# Patient Record
Sex: Female | Born: 1975 | Race: White | Hispanic: No | Marital: Married | Smoking: Never smoker
Health system: Southern US, Community
[De-identification: ages and names within clinical notes are randomized; demographics above are authoritative.]

## PROBLEM LIST (undated history)

## (undated) DIAGNOSIS — Z8049 Family history of malignant neoplasm of other genital organs: Secondary | ICD-10-CM

## (undated) DIAGNOSIS — G43109 Migraine with aura, not intractable, without status migrainosus: Secondary | ICD-10-CM

## (undated) DIAGNOSIS — Z8041 Family history of malignant neoplasm of ovary: Secondary | ICD-10-CM

## (undated) DIAGNOSIS — Z8669 Personal history of other diseases of the nervous system and sense organs: Secondary | ICD-10-CM

## (undated) DIAGNOSIS — B379 Candidiasis, unspecified: Secondary | ICD-10-CM

## (undated) DIAGNOSIS — Z803 Family history of malignant neoplasm of breast: Secondary | ICD-10-CM

## (undated) DIAGNOSIS — N3010 Interstitial cystitis (chronic) without hematuria: Secondary | ICD-10-CM

## (undated) DIAGNOSIS — Z800 Family history of malignant neoplasm of digestive organs: Secondary | ICD-10-CM

## (undated) DIAGNOSIS — R51 Headache: Secondary | ICD-10-CM

## (undated) HISTORY — DX: Migraine with aura, not intractable, without status migrainosus: G43.109

## (undated) HISTORY — DX: Family history of malignant neoplasm of ovary: Z80.41

## (undated) HISTORY — DX: Family history of malignant neoplasm of digestive organs: Z80.0

## (undated) HISTORY — PX: WISDOM TOOTH EXTRACTION: SHX21

## (undated) HISTORY — DX: Personal history of other diseases of the nervous system and sense organs: Z86.69

## (undated) HISTORY — PX: BREAST SURGERY: SHX581

## (undated) HISTORY — PX: TONSILLECTOMY: SUR1361

## (undated) HISTORY — DX: Family history of malignant neoplasm of other genital organs: Z80.49

## (undated) HISTORY — DX: Candidiasis, unspecified: B37.9

## (undated) HISTORY — DX: Family history of malignant neoplasm of breast: Z80.3

## (undated) HISTORY — DX: Interstitial cystitis (chronic) without hematuria: N30.10

## (undated) HISTORY — PX: TOOTH EXTRACTION: SUR596

## (undated) HISTORY — PX: POLYPECTOMY: SHX149

---

## 2010-10-30 LAB — ABO/RH: RH Type: POSITIVE

## 2010-10-30 LAB — HEPATITIS B SURFACE ANTIGEN: Hepatitis B Surface Ag: NEGATIVE

## 2010-10-30 LAB — ANTIBODY SCREEN: Antibody Screen: NEGATIVE

## 2010-10-30 LAB — RPR: RPR: NONREACTIVE

## 2010-11-09 ENCOUNTER — Other Ambulatory Visit: Payer: Self-pay | Admitting: Obstetrics and Gynecology

## 2010-11-09 DIAGNOSIS — N63 Unspecified lump in unspecified breast: Secondary | ICD-10-CM

## 2010-11-12 ENCOUNTER — Other Ambulatory Visit: Payer: Self-pay

## 2010-11-14 ENCOUNTER — Ambulatory Visit
Admission: RE | Admit: 2010-11-14 | Discharge: 2010-11-14 | Disposition: A | Discharge status: Home / Self Care | Payer: 59 | Source: Ambulatory Visit | Attending: Obstetrics and Gynecology | Admitting: Obstetrics and Gynecology

## 2010-11-14 DIAGNOSIS — N63 Unspecified lump in unspecified breast: Secondary | ICD-10-CM

## 2011-03-26 NOTE — L&D Delivery Note (Signed)
Delivery Note At 5:10 PM a viable female was delivered via Vaginal, Spontaneous Delivery (Presentation: Left Occiput Anterior).  APGAR: 9, ; weight .   Placenta status: Intact, Spontaneous.  Cord: 3 vessel.Water birth with pt leaning over edge of tub up out of water, baby born above water, Pt, assisted to sit in water  With baby in arms after delivery.  Anesthesia: Local  Episiotomy: None Lacerations: 2nd degree;Periurethral Suture Repair: 3.0 monocryl Est. Blood Loss (mL): 200  Mom to postpartum.  Baby to rooming in.  Tia Hieronymus 05/31/2011, 6:32 PM

## 2011-05-17 LAB — STREP B DNA PROBE: GBS: NEGATIVE

## 2011-05-23 ENCOUNTER — Encounter (INDEPENDENT_AMBULATORY_CARE_PROVIDER_SITE_OTHER): Payer: 59

## 2011-05-23 DIAGNOSIS — Z331 Pregnant state, incidental: Secondary | ICD-10-CM

## 2011-05-29 ENCOUNTER — Encounter (INDEPENDENT_AMBULATORY_CARE_PROVIDER_SITE_OTHER): Payer: 59

## 2011-05-29 DIAGNOSIS — Z331 Pregnant state, incidental: Secondary | ICD-10-CM

## 2011-05-31 ENCOUNTER — Encounter: Payer: Self-pay | Admitting: Registered Nurse

## 2011-05-31 ENCOUNTER — Inpatient Hospital Stay (HOSPITAL_COMMUNITY)
Admission: AD | Admit: 2011-05-31 | Discharge: 2011-06-02 | DRG: 775 | Disposition: A | Discharge status: Home / Self Care | Payer: 59 | Source: Ambulatory Visit | Attending: Obstetrics and Gynecology | Admitting: Obstetrics and Gynecology

## 2011-05-31 ENCOUNTER — Encounter (HOSPITAL_COMMUNITY): Payer: Self-pay | Admitting: *Deleted

## 2011-05-31 DIAGNOSIS — IMO0001 Reserved for inherently not codable concepts without codable children: Secondary | ICD-10-CM

## 2011-05-31 HISTORY — DX: Headache: R51

## 2011-05-31 LAB — CBC
HCT: 37.1 % (ref 36.0–46.0)
MCHC: 34.2 g/dL (ref 30.0–36.0)
MCV: 92.5 fL (ref 78.0–100.0)
Platelets: 210 10*3/uL (ref 150–400)
RDW: 13 % (ref 11.5–15.5)
WBC: 9.3 10*3/uL (ref 4.0–10.5)

## 2011-05-31 MED ORDER — BENZOCAINE-MENTHOL 20-0.5 % EX AERO
1.0000 "application " | INHALATION_SPRAY | CUTANEOUS | Status: DC | PRN
  Administered 2011-05-31: 1 via TOPICAL

## 2011-05-31 MED ORDER — ONDANSETRON HCL 4 MG/2ML IJ SOLN: 4.0000 mg | INTRAMUSCULAR | Status: DC | PRN

## 2011-05-31 MED ORDER — SODIUM CHLORIDE 0.9 % IJ SOLN: 3.0000 mL | INTRAMUSCULAR | Status: DC | PRN

## 2011-05-31 MED ORDER — OXYTOCIN BOLUS FROM INFUSION
500.0000 mL | Freq: Once | INTRAVENOUS | Status: DC
  Filled 2011-05-31: qty 1000
  Filled 2011-05-31: qty 500

## 2011-05-31 MED ORDER — OXYCODONE-ACETAMINOPHEN 5-325 MG PO TABS: 1.0000 | ORAL_TABLET | ORAL | Status: DC | PRN

## 2011-05-31 MED ORDER — CITRIC ACID-SODIUM CITRATE 334-500 MG/5ML PO SOLN: 30.0000 mL | ORAL | Status: DC | PRN

## 2011-05-31 MED ORDER — OXYTOCIN 20 UNITS IN LACTATED RINGERS INFUSION - SIMPLE
125.0000 mL/h | Freq: Once | INTRAVENOUS | Status: DC
Start: 2011-05-31 — End: 2011-05-31

## 2011-05-31 MED ORDER — ONDANSETRON HCL 4 MG/2ML IJ SOLN: 4.0000 mg | Freq: Four times a day (QID) | INTRAMUSCULAR | Status: DC | PRN

## 2011-05-31 MED ORDER — IBUPROFEN 600 MG PO TABS
600.0000 mg | ORAL_TABLET | Freq: Four times a day (QID) | ORAL | Status: DC | PRN
  Administered 2011-05-31: 600 mg via ORAL
  Filled 2011-05-31: qty 1

## 2011-05-31 MED ORDER — FLEET ENEMA 7-19 GM/118ML RE ENEM: 1.0000 | ENEMA | RECTAL | Status: DC | PRN

## 2011-05-31 MED ORDER — ZOLPIDEM TARTRATE 5 MG PO TABS: 5.0000 mg | ORAL_TABLET | Freq: Every evening | ORAL | Status: DC | PRN

## 2011-05-31 MED ORDER — BENZOCAINE-MENTHOL 20-0.5 % EX AERO
INHALATION_SPRAY | CUTANEOUS | Status: AC
  Administered 2011-05-31: 1 via TOPICAL
  Filled 2011-05-31: qty 56

## 2011-05-31 MED ORDER — TETANUS-DIPHTH-ACELL PERTUSSIS 5-2.5-18.5 LF-MCG/0.5 IM SUSP: 0.5000 mL | Freq: Once | INTRAMUSCULAR | Status: DC

## 2011-05-31 MED ORDER — LANOLIN HYDROUS EX OINT: TOPICAL_OINTMENT | CUTANEOUS | Status: DC | PRN

## 2011-05-31 MED ORDER — DIPHENHYDRAMINE HCL 25 MG PO CAPS: 25.0000 mg | ORAL_CAPSULE | Freq: Four times a day (QID) | ORAL | Status: DC | PRN

## 2011-05-31 MED ORDER — ACETAMINOPHEN 325 MG PO TABS: 650.0000 mg | ORAL_TABLET | ORAL | Status: DC | PRN

## 2011-05-31 MED ORDER — SODIUM CHLORIDE 0.9 % IJ SOLN: 3.0000 mL | Freq: Two times a day (BID) | INTRAMUSCULAR | Status: DC

## 2011-05-31 MED ORDER — SODIUM CHLORIDE 0.9 % IJ SOLN
INTRAMUSCULAR | Status: AC
  Filled 2011-05-31: qty 6

## 2011-05-31 MED ORDER — IBUPROFEN 600 MG PO TABS
600.0000 mg | ORAL_TABLET | Freq: Four times a day (QID) | ORAL | Status: DC
  Administered 2011-06-01 – 2011-06-02 (×7): 600 mg via ORAL
  Filled 2011-05-31 (×7): qty 1

## 2011-05-31 MED ORDER — LACTATED RINGERS IV SOLN: 500.0000 mL | INTRAVENOUS | Status: DC | PRN

## 2011-05-31 MED ORDER — DIBUCAINE 1 % RE OINT: 1.0000 "application " | TOPICAL_OINTMENT | RECTAL | Status: DC | PRN

## 2011-05-31 MED ORDER — SENNOSIDES-DOCUSATE SODIUM 8.6-50 MG PO TABS
2.0000 | ORAL_TABLET | Freq: Every day | ORAL | Status: DC
  Administered 2011-05-31 – 2011-06-01 (×2): 2 via ORAL

## 2011-05-31 MED ORDER — LIDOCAINE HCL (PF) 1 % IJ SOLN
30.0000 mL | INTRAMUSCULAR | Status: DC | PRN
  Administered 2011-05-31: 30 mL via SUBCUTANEOUS
  Filled 2011-05-31: qty 30

## 2011-05-31 MED ORDER — SIMETHICONE 80 MG PO CHEW: 80.0000 mg | CHEWABLE_TABLET | ORAL | Status: DC | PRN

## 2011-05-31 MED ORDER — WITCH HAZEL-GLYCERIN EX PADS: 1.0000 "application " | MEDICATED_PAD | CUTANEOUS | Status: DC | PRN

## 2011-05-31 MED ORDER — BUTORPHANOL TARTRATE 2 MG/ML IJ SOLN: 1.0000 mg | INTRAMUSCULAR | Status: DC | PRN

## 2011-05-31 MED ORDER — PRENATAL MULTIVITAMIN CH
1.0000 | ORAL_TABLET | Freq: Every day | ORAL | Status: DC
  Administered 2011-06-01 – 2011-06-02 (×2): 1 via ORAL
  Filled 2011-05-31 (×2): qty 1

## 2011-05-31 MED ORDER — SODIUM CHLORIDE 0.9 % IV SOLN: 250.0000 mL | INTRAVENOUS | Status: DC | PRN

## 2011-05-31 MED ORDER — ONDANSETRON HCL 4 MG PO TABS: 4.0000 mg | ORAL_TABLET | ORAL | Status: DC | PRN

## 2011-05-31 NOTE — H&P (Signed)
Cynthia Dodson is a 36 y.o. female presenting for c/o of contractions, denies srom or vag bleeding, with +FM, plans water birth. History OB History    Grav Para Term Preterm Abortions TAB SAB Ect Mult Living   3 2 2  0 0 0 0 0 0 2     Past Medical History  Diagnosis Date  . Headache    Past Surgical History  Procedure Date  . Polypectomy   . Tonsillectomy    Family History: family history includes Cancer in her mother and Depression in her mother. Social History:  reports that she has never smoked. She does not have any smokeless tobacco history on file. She reports that she does not drink alcohol or use illicit drugs.  ROS Blood pressure 136/102, pulse 82, temperature 98 F (36.7 C), temperature source Oral, resp. rate 20, height 5\' 4"  (1.626 m), weight 62.596 kg (138 lb). Exam Physical Exam calm, no distress, lungs clear bilaterally, AP RRR, abd soft, gravid, nt, bowel sounds active, no edema, Fhts category 1 uc moderate vag 4 100 -1 VTX  Prenatal labs: ABO, Rh: B/Positive/-- (08/07 0000) Antibody: Negative (08/07 0000) Rubella: Immune (08/07 0000) RPR: Nonreactive (08/07 0000)  HBsAg: Negative (08/07 0000)  HIV: Non-reactive (08/07 0000)  GBS: Negative (02/22 0000)   Assessment/Plan: 40 week IUP Active labor P routine admission, collaboration with Dr. Estanislado Pandy per telephone.   Evyn Putzier 05/31/2011, 6:41 PM

## 2011-06-01 ENCOUNTER — Encounter (HOSPITAL_COMMUNITY): Payer: Self-pay | Admitting: *Deleted

## 2011-06-01 LAB — CBC
MCH: 31.8 pg (ref 26.0–34.0)
Platelets: 172 10*3/uL (ref 150–400)
RBC: 3.59 MIL/uL — ABNORMAL LOW (ref 3.87–5.11)
WBC: 9.4 10*3/uL (ref 4.0–10.5)

## 2011-06-01 NOTE — Progress Notes (Signed)
Post Partum Day 1 Subjective:  Well. Lochia are normal. Voiding, ambulating, tolerating normal diet. feeding going well.  Objective: Blood pressure 132/87, pulse 69, temperature 98.3 F (36.8 C), temperature source Oral, resp. rate 18, height 5\' 4"  (1.626 m), weight 62.596 kg (138 lb), SpO2 97.00%, unknown if currently breastfeeding.  Physical Exam:  General: normal Lochia: appropriate Uterine Fundus: 0/1 firm non-tender  Extremities: No evidence of DVT seen on physical exam. Edema none     Basename 06/01/11 0516 05/31/11 1550  HGB 11.4* 12.7  HCT 33.1* 37.1    Assessment/Plan: Normal Post-partum. Continue routine post-partum care. Anticipate discharge tomorrow Newborn Circumcision reviewed and performed without complications    LOS: 1 day   Suprina Mandeville A MD 06/01/2011, 9:18 AM

## 2011-06-01 NOTE — H&P (Signed)
Kariel Skillman is a 36 y.o. female presenting for painful contractions, denies srom or vag bleeding,with +FM, plans water birth History OB History    Grav Para Term Preterm Abortions TAB SAB Ect Mult Living   3 3 3  0 0 0 0 0 0 3     Past Medical History  Diagnosis Date  . Headache    Past Surgical History  Procedure Date  . Polypectomy   . Tonsillectomy    Family History: family history includes Cancer in her mother and Depression in her mother. Social History:  reports that she has never smoked. She does not have any smokeless tobacco history on file. She reports that she does not drink alcohol or use illicit drugs.  ROS  Exam 4 100 -1 VTX I, fhts category one, uc mod  Physical Exam alert, cooperative, lungs clear bilaterally, AP RRR, abd soft, gravid, nt, no edema lower extremities. Prenatal labs: ABO, Rh: B/Positive/-- (08/07 0000) Antibody: Negative (08/07 0000) Rubella: Immune (08/07 0000) RPR: NON REACTIVE (03/08 1550)  HBsAg: Negative (08/07 0000)  HIV: Non-reactive (08/07 0000)  GBS: Negative (02/22 0000)   Assessment/Plan: 40 week IUP Active labor P routine admission, intermittent fhts, collaboration with Dr. Estanislado Pandy at birth suites. Lavera Guise, CNM   [facility] 06/01/2011, 2:26 PM

## 2011-06-02 MED ORDER — IBUPROFEN 600 MG PO TABS: 600.0000 mg | ORAL_TABLET | Freq: Four times a day (QID) | ORAL | Status: AC | PRN

## 2011-06-02 NOTE — Discharge Summary (Signed)
Physician Discharge Summary  Patient ID: Cynthia Dodson MRN: [id number] DOB/AGE: 34/24/1977 34 y.o.  Admit date: 05/31/2011 Discharge date: 06/02/2011  Admission Diagnoses: 40 week IUP active labor  Discharge Diagnoses:  Active Problems:  Vaginal delivery lactating 2nd degree MLL  Discharged Condition: stable  Hospital Course: 40 week IUP active labor, SVD, water birth, 2nd degree MLL, normal involution  Consults: None  Significant Diagnostic Studies: labs:  Results for orders placed during the hospital encounter of 05/31/11 (from the past 48 hour(s))  CBC     Status: Normal   Collection Time   05/31/11  3:50 PM      Component Value Range Comment   WBC 9.3  4.0 - 10.5 (K/uL)    RBC 4.01  3.87 - 5.11 (MIL/uL)    Hemoglobin 12.7  12.0 - 15.0 (g/dL)    HCT 40.9  81.1 - 91.4 (%)    MCV 92.5  78.0 - 100.0 (fL)    MCH 31.7  26.0 - 34.0 (pg)    MCHC 34.2  30.0 - 36.0 (g/dL)    RDW 78.2  95.6 - 21.3 (%)    Platelets 210  150 - 400 (K/uL)   RPR     Status: Normal   Collection Time   05/31/11  3:50 PM      Component Value Range Comment   RPR NON REACTIVE  NON REACTIVE    CBC     Status: Abnormal   Collection Time   06/01/11  5:16 AM      Component Value Range Comment   WBC 9.4  4.0 - 10.5 (K/uL)    RBC 3.59 (*) 3.87 - 5.11 (MIL/uL)    Hemoglobin 11.4 (*) 12.0 - 15.0 (g/dL)    HCT 08.6 (*) 57.8 - 46.0 (%)    MCV 92.2  78.0 - 100.0 (fL)    MCH 31.8  26.0 - 34.0 (pg)    MCHC 34.4  30.0 - 36.0 (g/dL)    RDW 46.9  62.9 - 52.8 (%)    Platelets 172  150 - 400 (K/uL)     Treatments: IV hydration  Discharge Exam: Blood pressure 99/65, pulse 68, temperature 98.4 F (36.9 C), temperature source Oral, resp. rate 18, height 5\' 4"  (1.626 m), weight 62.596 kg (138 lb), SpO2 97.00%, unknown if currently breastfeeding. General appearance: alert, cooperative and no distress Extremities: Homans sign is negative, no sign of DVT and no edema, redness or tenderness in the calves or  thighs Incision/Wound: 2nd degree MLL well approximated no redness, edema, or drainage FF 4 below U, sm serosa flow  Disposition: Final discharge disposition not confirmed  Discharge Orders    Future Appointments: Provider: Department: Dept Phone: Center:   06/05/2011 1:45 PM Esmeralda Arthur, MD [facility] [phone] None     Future Orders Please Complete By Expires   Strep B DNA probe      Comments:   This external order was created through the Results Console.   HIV antibody      Comments:   This external order was created through the Results Console.   GC/chlamydia probe amp, genital      Comments:   This external order was created through the Results Console.   Rubella antibody, IgM      Comments:   This external order was created through the Results Console.   Hepatitis B surface antigen      Comments:   This external order was created through the Results Console.  RPR      Comments:   This external order was created through the Results Console.   Antibody screen      Comments:   This external order was created through the Results Console.   ABO/Rh      Comments:   This external order was created through the Results Console.     Medication List  As of 06/02/2011 10:34 AM   STOP taking these medications         acetaminophen 500 MG tablet         TAKE these medications         ibuprofen 600 MG tablet   Commonly known as: ADVIL,MOTRIN   Take 1 tablet (600 mg total) by mouth every 6 (six) hours as needed for pain.      prenatal multivitamin Tabs   Take 1 tablet by mouth 2 (two) times daily.           Follow-up Information    Follow up with [facility] in 6 weeks.        [facility] handout to pt. SignedLavera Guise 06/02/2011, 10:34 AM

## 2011-06-02 NOTE — Discharge Instructions (Signed)
Breastfeeding BENEFITS OF BREASTFEEDING For the baby  The first milk (colostrum) helps the baby's digestive system function better.   There are antibodies from the mother in the milk that help the baby fight off infections.   The baby has a lower incidence of asthma, allergies, and SIDS (sudden infant death syndrome).   The nutrients in breast milk are better than formulas for the baby and helps the baby's brain grow better.   Babies who breastfeed have less gas, colic, and constipation.  For the mother  Breastfeeding helps develop a very special bond between mother and baby.   It is more convenient, always available at the correct temperature and cheaper than formula feeding.   It burns calories in the mother and helps with losing weight that was gained during pregnancy.   It makes the uterus contract back down to normal size faster and slows bleeding following delivery.   Breastfeeding mothers have a lower risk of developing breast cancer.  NURSE FREQUENTLY  A healthy, full-term baby may breastfeed as often as every hour or space his or her feedings to every 3 hours.   How often to nurse will vary from baby to baby. Watch your baby for signs of hunger, not the clock.   Nurse as often as the baby requests, or when you feel the need to reduce the fullness of your breasts.   Awaken the baby if it has been 3 to 4 hours since the last feeding.   Frequent feeding will help the mother make more milk and will prevent problems like sore nipples and engorgement of the breasts.  BABY'S POSITION AT THE BREAST  Whether lying down or sitting, be sure that the baby's tummy is facing your tummy.   Support the breast with 4 fingers underneath the breast and the thumb above. Make sure your fingers are well away from the nipple and baby's mouth.   Stroke the baby's lips and cheek closest to the breast gently with your finger or nipple.   When the baby's mouth is open wide enough, place all  of your nipple and as much of the dark area around the nipple as possible into your baby's mouth.   Pull the baby in close so the tip of the nose and the baby's cheeks touch the breast during the feeding.  FEEDINGS  The length of each feeding varies from baby to baby and from feeding to feeding.   The baby must suck about 2 to 3 minutes for your milk to get to him or her. This is called a "let down." For this reason, allow the baby to feed on each breast as long as he or she wants. Your baby will end the feeding when he or she has received the right balance of nutrients.   To break the suction, put your finger into the corner of the baby's mouth and slide it between his or her gums before removing your breast from his or her mouth. This will help prevent sore nipples.  REDUCING BREAST ENGORGEMENT  In the first week after your baby is born, you may experience signs of breast engorgement. When breasts are engorged, they feel heavy, warm, full, and may be tender to the touch. You can reduce engorgement if you:   Nurse frequently, every 2 to 3 hours. Mothers who breastfeed early and often have fewer problems with engorgement.   Place light ice packs on your breasts between feedings. This reduces swelling. Wrap the ice packs in a   lightweight towel to protect your skin.   Apply moist hot packs to your breast for 5 to 10 minutes before each feeding. This increases circulation and helps the milk flow.   Gently massage your breast before and during the feeding.   Make sure that the baby empties at least one breast at every feeding before switching sides.   Use a breast pump to empty the breasts if your baby is sleepy or not nursing well. You may also want to pump if you are returning to work or or you feel you are getting engorged.   Avoid bottle feeds, pacifiers or supplemental feedings of water or juice in place of breastfeeding.   Be sure the baby is latched on and positioned properly while  breastfeeding.   Prevent fatigue, stress, and anemia.   Wear a supportive bra, avoiding underwire styles.   Eat a balanced diet with enough fluids.  If you follow these suggestions, your engorgement should improve in 24 to 48 hours. If you are still experiencing difficulty, call your lactation consultant or caregiver. IS MY BABY GETTING ENOUGH MILK? Sometimes, mothers worry about whether their babies are getting enough milk. You can be assured that your baby is getting enough milk if:  The baby is actively sucking and you hear swallowing.   The baby nurses at least 8 to 12 times in a 24 hour time period. Nurse your baby until he or she unlatches or falls asleep at the first breast (at least 10 to 20 minutes), then offer the second side.   The baby is wetting 5 to 6 disposable diapers (6 to 8 cloth diapers) in a 24 hour period by 5 to 6 days of age.   The baby is having at least 2 to 3 stools every 24 hours for the first few months. Breast milk is all the food your baby needs. It is not necessary for your baby to have water or formula. In fact, to help your breasts make more milk, it is best not to give your baby supplemental feedings during the early weeks.   The stool should be soft and yellow.   The baby should gain 4 to 7 ounces per week after he is 4 days old.  TAKE CARE OF YOURSELF Take care of your breasts by:  Bathing or showering daily.   Avoiding the use of soaps on your nipples.   Start feedings on your left breast at one feeding and on your right breast at the next feeding.   You will notice an increase in your milk supply 2 to 5 days after delivery. You may feel some discomfort from engorgement, which makes your breasts very firm and often tender. Engorgement "peaks" out within 24 to 48 hours. In the meantime, apply warm moist towels to your breasts for 5 to 10 minutes before feeding. Gentle massage and expression of some milk before feeding will soften your breasts, making  it easier for your baby to latch on. Wear a well fitting nursing bra and air dry your nipples for 10 to 15 minutes after each feeding.   Only use cotton bra pads.   Only use pure lanolin on your nipples after nursing. You do not need to wash it off before nursing.  Take care of yourself by:   Eating well-balanced meals and nutritious snacks.   Drinking milk, fruit juice, and water to satisfy your thirst (about 8 glasses a day).   Getting plenty of rest.   Increasing calcium in   your diet (1200 mg a day).   Avoiding foods that you notice affect the baby in a bad way.  SEEK MEDICAL CARE IF:   You have any questions or difficulty with breastfeeding.   You need help.   You have a hard, red, sore area on your breast, accompanied by a fever of 100.5 F (38.1 C) or more.   Your baby is too sleepy to eat well or is having trouble sleeping.   Your baby is wetting less than 6 diapers per day, by 5 days of age.   Your baby's skin or white part of his or her eyes is more yellow than it was in the hospital.   You feel depressed.  Document Released: 03/11/2005 Document Revised: 02/28/2011 Document Reviewed: 10/24/2008 [employer] Patient Information 2012 [employer]. Vaginal Delivery Care After  Change your pad on each trip to the bathroom.   Wipe gently with toilet paper during your hospital stay. Always wipe from front to back. A spray bottle with warm tap water could also be used or a towelette if available.   Place your soiled pad and toilet paper in a bathroom wastebasket with a plastic bag liner.   During your hospital stay, save any clots. If you pass a clot while on the toilet, do not flush it. Also, if your vaginal flow seems excessive to you, notify nursing personnel.   The first time you get out of bed after delivery, wait for assistance from a nurse. Do not get up alone at any time if you feel weak or dizzy.   Bend and extend your ankles forcefully so that you feel the  calves of your legs get hard. Do this 6 times every hour when you are in bed and awake.   Do not sit with one foot under you, dangle your legs over the edge of the bed, or maintain a position that hinders the circulation in your legs.   Many women experience after pains for 2 to 3 days after delivery. These after pains are mild uterine contractions. Ask the nurse for a pain medication if you need something for this. Sometimes breastfeeding stimulates after pains; if you find this to be true, ask for the medication  -  hour before the next feeding.   For you and your infant's protection, do not go beyond the door(s) of the obstetric unit. Do not carry your baby in your arms in the hallway. When taking your baby to and from your room, put your baby in the bassinet and push the bassinet.   Mothers may have their babies in their room as much as they desire.  Document Released: 03/08/2000 Document Revised: 02/28/2011 Document Reviewed: 02/06/2007 [employer] Patient Information 2012 [employer]. 

## 2011-06-05 ENCOUNTER — Encounter: Payer: 59 | Admitting: Obstetrics and Gynecology

## 2011-07-12 ENCOUNTER — Ambulatory Visit (INDEPENDENT_AMBULATORY_CARE_PROVIDER_SITE_OTHER): Payer: 59 | Admitting: Obstetrics and Gynecology

## 2011-07-12 NOTE — Progress Notes (Addendum)
Date of delivery: 05/31/2011 Female Name: Vita Erm Vaginal delivery:yes Cesarean section:no Tubal ligation:no GDM:no Breast Feeding:yes Bottle Feeding:yes Post-Partum Blues:no Abnormal pap:no Normal GU function: yes Normal GI function:yes Returning to work:yes; will be returning back to work Jul 29, 2011  Wants to discuss contraception (Mirena)   Wants Mirena--had before, did well. Doing great.  No issues. Recommend Kegels

## 2011-08-05 ENCOUNTER — Encounter: Payer: Self-pay | Admitting: Obstetrics and Gynecology

## 2011-08-05 ENCOUNTER — Ambulatory Visit (INDEPENDENT_AMBULATORY_CARE_PROVIDER_SITE_OTHER): Payer: 59 | Admitting: Obstetrics and Gynecology

## 2011-08-05 VITALS — BP 94/62 | HR 70 | Ht 64.0 in | Wt 120.0 lb

## 2011-08-05 DIAGNOSIS — Z8669 Personal history of other diseases of the nervous system and sense organs: Secondary | ICD-10-CM | POA: Insufficient documentation

## 2011-08-05 DIAGNOSIS — R519 Headache, unspecified: Secondary | ICD-10-CM | POA: Insufficient documentation

## 2011-08-05 DIAGNOSIS — N3010 Interstitial cystitis (chronic) without hematuria: Secondary | ICD-10-CM

## 2011-08-05 DIAGNOSIS — B49 Unspecified mycosis: Secondary | ICD-10-CM

## 2011-08-05 DIAGNOSIS — Z30430 Encounter for insertion of intrauterine contraceptive device: Secondary | ICD-10-CM

## 2011-08-05 DIAGNOSIS — R51 Headache: Secondary | ICD-10-CM

## 2011-08-05 DIAGNOSIS — Z975 Presence of (intrauterine) contraceptive device: Secondary | ICD-10-CM

## 2011-08-05 DIAGNOSIS — B379 Candidiasis, unspecified: Secondary | ICD-10-CM | POA: Insufficient documentation

## 2011-08-05 LAB — POCT URINE PREGNANCY: Preg Test, Ur: NEGATIVE

## 2011-08-05 MED ORDER — LEVONORGESTREL 20 MCG/24HR IU IUD
INTRAUTERINE_SYSTEM | Freq: Once | INTRAUTERINE | Status: AC
  Administered 2011-08-05: 15:00:00 via INTRAUTERINE

## 2011-08-05 NOTE — Progress Notes (Signed)
Addended byWinfred Leeds on: 08/05/2011 03:22 PM   Modules accepted: Orders

## 2011-08-05 NOTE — Progress Notes (Signed)
IUD INSERTION NOTE  Cynthia Dodson is a 36 y.o. female [phone] who presents for IUD insertion. S/P SVB 05/31/11 with labial laceration. Has had the IUD before (Mirena) and has read the IUD information sheet-denies any questions.  Consent signed after risks and benefits were reviewed including but not limited to bleeding, infection, expulsion and risk of uterine perforation that may require an additional procedure for removal.  LMP: No LMP recorded. Patient is not currently having periods (Reason: Other). UPT: negative GC / Chlamydia: not applicable  MIRENA LOT NUMBER: [id number]   Prepping with Betadine Tenaculum placed on anterior lip of cervix after Hurricane gel was applied Uterus sounded at  7 cm Insertion of MIRENA IUD per protocol without any complications   Assessment:  IUD Insertion  Plan:  1. Patient instructed to call with oral temperature of 100.4 degrees Fahrenheit or more, excessive bleeding or pain that is not relieved with OTC analgesia taken as directed  2. Patient instructed on how  to check IUD strings and encouraged to do so after each menstrual cycle  3. Advised not to place anything in vagina or have sexual intercourse for 7 days  4. Follow-up: 4 weeks   Raylyn Carton PA-C 08/05/2011 9:25 AM

## 2011-08-05 NOTE — Patient Instructions (Signed)
Schedule follow up in 4 weeks  Call [facility] [phone]:  -for temperature of 100.4 degrees Fahrenheit or more -pain not improved with over the counter pain medications (Ibuprofen, Advil, Aleve,        Tylenol or acetaminophen) -for excessive bleeding (more than a usual period) -for any other concerns  Do not place anything in your vagina for the next 7 days   

## 2011-09-03 ENCOUNTER — Encounter: Payer: 59 | Admitting: Obstetrics and Gynecology

## 2014-01-24 ENCOUNTER — Encounter: Payer: Self-pay | Admitting: Obstetrics and Gynecology

## 2018-11-12 ENCOUNTER — Other Ambulatory Visit: Payer: Self-pay

## 2018-11-16 ENCOUNTER — Encounter: Payer: Self-pay | Admitting: Obstetrics and Gynecology

## 2018-11-16 ENCOUNTER — Ambulatory Visit: Payer: 59 | Admitting: Obstetrics and Gynecology

## 2018-11-16 ENCOUNTER — Other Ambulatory Visit: Payer: Self-pay

## 2018-11-16 VITALS — BP 112/62 | HR 72 | Temp 98.3°F | Ht 63.78 in | Wt 121.0 lb

## 2018-11-16 DIAGNOSIS — N8111 Cystocele, midline: Secondary | ICD-10-CM

## 2018-11-16 DIAGNOSIS — Z30432 Encounter for removal of intrauterine contraceptive device: Secondary | ICD-10-CM

## 2018-11-16 DIAGNOSIS — Z8041 Family history of malignant neoplasm of ovary: Secondary | ICD-10-CM | POA: Diagnosis not present

## 2018-11-16 DIAGNOSIS — Z01419 Encounter for gynecological examination (general) (routine) without abnormal findings: Secondary | ICD-10-CM

## 2018-11-16 DIAGNOSIS — Z8049 Family history of malignant neoplasm of other genital organs: Secondary | ICD-10-CM

## 2018-11-16 DIAGNOSIS — N816 Rectocele: Secondary | ICD-10-CM

## 2018-11-16 DIAGNOSIS — N8189 Other female genital prolapse: Secondary | ICD-10-CM | POA: Insufficient documentation

## 2018-11-16 DIAGNOSIS — Z0000 Encounter for general adult medical examination without abnormal findings: Secondary | ICD-10-CM

## 2018-11-16 DIAGNOSIS — G43109 Migraine with aura, not intractable, without status migrainosus: Secondary | ICD-10-CM | POA: Diagnosis not present

## 2018-11-16 NOTE — Patient Instructions (Addendum)
EXERCISE AND DIET:  We recommended that you start or continue a regular exercise program for good health. Regular exercise means any activity that makes your heart beat faster and makes you sweat.  We recommend exercising at least 30 minutes per day at least 3 days a week, preferably 4 or 5.  We also recommend a diet low in fat and sugar.  Inactivity, poor dietary choices and obesity can cause diabetes, heart attack, stroke, and kidney damage, among others.    ALCOHOL AND SMOKING:  Women should limit their alcohol intake to no more than 7 drinks/beers/glasses of wine (combined, not each!) per week. Moderation of alcohol intake to this level decreases your risk of breast cancer and liver damage. And of course, no recreational drugs are part of a healthy lifestyle.  And absolutely no smoking or even second hand smoke. Most people know smoking can cause heart and lung diseases, but did you know it also contributes to weakening of your bones? Aging of your skin?  Yellowing of your teeth and nails?  CALCIUM AND VITAMIN D:  Adequate intake of calcium and Vitamin D are recommended.  The recommendations for exact amounts of these supplements seem to change often, but generally speaking 1,000 mg of calcium (between diet and supplement) and 800 units of Vitamin D per day seems prudent. Certain women may benefit from higher intake of Vitamin D.  If you are among these women, your doctor will have told you during your visit.    PAP SMEARS:  Pap smears, to check for cervical cancer or precancers,  have traditionally been done yearly, although recent scientific advances have shown that most women can have pap smears less often.  However, every woman still should have a physical exam from her gynecologist every year. It will include a breast check, inspection of the vulva and vagina to check for abnormal growths or skin changes, a visual exam of the cervix, and then an exam to evaluate the size and shape of the uterus and  ovaries.  And after 41 years of age, a rectal exam is indicated to check for rectal cancers. We will also provide age appropriate advice regarding health maintenance, like when you should have certain vaccines, screening for sexually transmitted diseases, bone density testing, colonoscopy, mammograms, etc.   MAMMOGRAMS:  All women over 75 years old should have a yearly mammogram. Many facilities now offer a "3D" mammogram, which may cost around $50 extra out of pocket. If possible,  we recommend you accept the option to have the 3D mammogram performed.  It both reduces the number of women who will be called back for extra views which then turn out to be normal, and it is better than the routine mammogram at detecting truly abnormal areas.    COLON CANCER SCREENING: Now recommend starting at age 57. At this time colonoscopy is not covered for routine screening until 50. There are take home tests that can be done between 45-49.   COLONOSCOPY:  Colonoscopy to screen for colon cancer is recommended for all women at age 91.  We know, you hate the idea of the prep.  We agree, BUT, having colon cancer and not knowing it is worse!!  Colon cancer so often starts as a polyp that can be seen and removed at colonscopy, which can quite literally save your life!  And if your first colonoscopy is normal and you have no family history of colon cancer, most women don't have to have it again for  10 years.  Once every ten years, you can do something that may end up saving your life, right?  We will be happy to help you get it scheduled when you are ready.  Be sure to check your insurance coverage so you understand how much it will cost.  It may be covered as a preventative service at no cost, but you should check your particular policy.   ° ° ° °Breast Self-Awareness °Breast self-awareness means being familiar with how your breasts look and feel. It involves checking your breasts regularly and reporting any changes to your  health care provider. °Practicing breast self-awareness is important. A change in your breasts can be a sign of a serious medical problem. Being familiar with how your breasts look and feel allows you to find any problems early, when treatment is more likely to be successful. All women should practice breast self-awareness, including women who have had breast implants. °How to do a breast self-exam °One way to learn what is normal for your breasts and whether your breasts are changing is to do a breast self-exam. To do a breast self-exam: °Look for Changes ° °1. Remove all the clothing above your waist. °2. Stand in front of a mirror in a room with good lighting. °3. Put your hands on your hips. °4. Push your hands firmly downward. °5. Compare your breasts in the mirror. Look for differences between them (asymmetry), such as: °? Differences in shape. °? Differences in size. °? Puckers, dips, and bumps in one breast and not the other. °6. Look at each breast for changes in your skin, such as: °? Redness. °? Scaly areas. °7. Look for changes in your nipples, such as: °? Discharge. °? Bleeding. °? Dimpling. °? Redness. °? A change in position. °Feel for Changes °Carefully feel your breasts for lumps and changes. It is best to do this while lying on your back on the floor and again while sitting or standing in the shower or tub with soapy water on your skin. Feel each breast in the following way: °· Place the arm on the side of the breast you are examining above your head. °· Feel your breast with the other hand. °· Start in the nipple area and make ¾ inch (2 cm) overlapping circles to feel your breast. Use the pads of your three middle fingers to do this. Apply light pressure, then medium pressure, then firm pressure. The light pressure will allow you to feel the tissue closest to the skin. The medium pressure will allow you to feel the tissue that is a little deeper. The firm pressure will allow you to feel the tissue  close to the ribs. °· Continue the overlapping circles, moving downward over the breast until you feel your ribs below your breast. °· Move one finger-width toward the center of the body. Continue to use the ¾ inch (2 cm) overlapping circles to feel your breast as you move slowly up toward your collarbone. °· Continue the up and down exam using all three pressures until you reach your armpit. ° °Write Down What You Find ° °Write down what is normal for each breast and any changes that you find. Keep a written record with breast changes or normal findings for each breast. By writing this information down, you do not need to depend only on memory for size, tenderness, or location. Write down where you are in your menstrual cycle, if you are still menstruating. °If you are having trouble noticing differences   in your breasts, do not get discouraged. With time you will become more familiar with the variations in your breasts and more comfortable with the exam. How often should I examine my breasts? Examine your breasts every month. If you are breastfeeding, the best time to examine your breasts is after a feeding or after using a breast pump. If you menstruate, the best time to examine your breasts is 5-7 days after your period is over. During your period, your breasts are lumpier, and it may be more difficult to notice changes. When should I see my health care provider? See your health care provider if you notice:  A change in shape or size of your breasts or nipples.  A change in the skin of your breast or nipples, such as a reddened or scaly area.  Unusual discharge from your nipples.  A lump or thick area that was not there before.  Pain in your breasts.  Anything that concerns you.   About Rectocele  Overview  A rectocele is a type of hernia which causes different degrees of bulging of the rectal tissues into the vaginal wall.  You may even notice that it presses against the vaginal wall so  much that some vaginal tissues droop outside of the opening of your vagina.  Causes of Rectocele  The most common cause is childbirth.  The muscles and ligaments in the pelvis that hold up and support the female organs and vagina become stretched and weakened during labor and delivery.  The more babies you have, the more the support tissues are stretched and weakened.  Not everyone who has a baby will develop a rectocele.  Some women have stronger supporting tissue in the pelvis and may not have as much of a problem as others.  Women who have a Cesarean section usually do not get rectocele's unless they pushed a long time prior to the cesarean delivery.  Other conditions that can cause a rectocele include chronic constipation, a chronic cough, a lot of heavy lifting, and obesity.  Older women may have this problem because the loss of female hormones causes the vaginal tissue to become weaker.  Symptoms  There may not be any symptoms.  If you do have symptoms, they may include:  Pelvic pressure in the rectal area  Protrusion of the lower part of the vagina through the opening of the vagina  Constipation and trapping of the stool, making it difficult to have a bowel movement.  In severe cases, you may have to press on the lower part of your vagina to help push the stool out of you rectum.  This is called splinting to empty.  Diagnosing Rectocele  Your health care provider will ask about your symptoms and perform a pelvic exam.  S/he will ask you to bear down, pushing like you are having a bowel movement so as to see how far the lower part of the vagina protrudes into the vagina and possible outside of the vagina.  Your provider will also ask you to contract the muscles of your pelvis (like you are stopping the stream in the middle of urinating) to determine the strength of your pelvic muscles.  Your provider may also do a rectal exam.  Treatment Options  If you do not have any symptoms, no  treatment may be necessary.  Other treatment options include:  Pelvic floor exercises: Contracting the muscles in your genital area may help strengthen your muscles and support the organs.  Be sure to   get proper exercise instruction from you physical therapist.  A pessary (removealbe pelvic support device) sometimes helps rectocele symptoms.  Surgery: Surgical repair may be necessary. In some cases the uterus may need to be taken out ( a hysterectomy) as well.  There are many types of surgery for pelvic support problems.  Look for physicians who specialize in repair procedures.  You can take care of yourself by:  Treating and preventing constipation  Avoiding heavy lifting, and lifting correctly (with your legs, not with you waist or back)  Treating a chronic cough or bronchitis  Not smoking  avoiding too much weight gain  Doing pelvic floor exercises   2007, [employer] Doc.33About Cystocele  Overview  The pelvic organs, including the bladder, are normally supported by pelvic floor muscles and ligaments.  When these muscles and ligaments are stretched, weakened or torn, the wall between the bladder and the vagina sags or herniates causing a prolapse, sometimes called a cystocele.  This condition may cause discomfort and problems with emptying the bladder.  It can be present in various stages.  Some people are not aware of the changes.  Others may notice changes at the vaginal opening or a feeling of the bladder dropping outside the body.  Causes of a Cystocele  A cystocele is usually caused by muscle straining or stretching during childbirth.  In addition, cystocele is more common after menopause, because the hormone estrogen helps keep the elastic tissues around the pelvic organs strong.  A cystocele is more likely to occur when levels of estrogen decrease.  Other causes include: heavy lifting, chronic coughing, previous pelvic surgery and obesity.  Symptoms  A  bladder that has dropped from its normal position may cause: unwanted urine leakage (stress incontinence), frequent urination or urge to urinate, incomplete emptying of the bladder (not feeling bladder relief after emptying), pain or discomfort in the vagina, pelvis, groin, lower back or lower abdomen and frequent urinary tract infections.  Mild cases may not cause any symptoms.  Treatment Options  Pelvic floor (Kegel) exercises:  Strength training the muscles in your genital area  Behavioral changes: Treating and preventing constipation, taking time to empty your bladder properly, learning to lift properly and/or avoid heavy lifting when possible, stopping smoking, avoiding weight gain and treating a chronic cough or bronchitis.  A pessary: A vaginal support device is sometimes used to help pelvic support caused by muscle and ligament changes.  Surgery: Surgical repair may be necessary if symptoms cannot be managed with exercise, behavioral changes and a pessary.  Surgery is usually considered for severe cases.   2007, [employer]

## 2018-11-16 NOTE — Progress Notes (Addendum)
43 y.o. [id number] Married White or Caucasian Not Hispanic or Latino female here for annual exam.  She has a mirena IUD, in for 7 years. No cycles with the IUD.   Mom had ovarian cancer in her 42's, she had a hysterectomy and BSO. Never had chemotherapy. Mom had negative genetic testing.  MGM with uterine cancer, also young. Died at 3.   She is not wanting to be on OCP's at this time.   Cycles were normal prior to the IUD.   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 She voids frequently, normal amounts. No urge incontinence, mild GSI with valsalva. She drinks 8-10 oz of coffee a day (doesn't always finish it). She previously saw PT for the [place], helped some.    No dyspareunia.      No LMP recorded. (Menstrual status: IUD).          Sexually active: Yes.    The current method of family planning is IUD, spouse with vasectomy. Exercising: Yes.    yoga, walking, jogging Smoker:  no  Health Maintenance: Pap:  2019 WNL per patient History of abnormal Pap:  no MMG:  11/14/2010 Birads 1 negative, possible had one at [facility] last year she thinks WNL    Colonoscopy: 2010 WNL per patient (lifelong constipation) TDaP:  Unsure Gardasil: No   reports that she has never smoked. She has never used smokeless tobacco. She reports current alcohol use. She reports that she does not use drugs. Drinks few glasses of wine a week. 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 Husband is a [employer]. Kids are 7, 62, 94.Oldest is a girl, younger 2 are boys.    Past Medical History:  Diagnosis Date  . Cystitis, interstitial   . Headache(784.0)   . Migraine with aura     Past Surgical History:  Procedure Laterality Date  . BREAST SURGERY    . POLYPECTOMY    . TONSILLECTOMY    . WISDOM TOOTH EXTRACTION    Breast augmentation.   Current Outpatient Medications  Medication Sig Dispense Refill  . Ascorbic Acid (VITAMIN C PO) Take by mouth.    . fish oil-omega-3 fatty acids 1000 MG capsule Take 2 g by  mouth daily.    Marland Kitchen levonorgestrel (MIRENA) 20 MCG/24HR IUD 1 each by Intrauterine route once.    . Multiple Vitamin (MULTIVITAMIN PO) Take by mouth.    . Multiple Vitamins-Minerals (ZINC PO) Take by mouth.    Marland Kitchen VITAMIN D PO Take by mouth.     No current facility-administered medications for this visit.     Family History  Problem Relation Age of Onset  . Cancer Mother        OVARIAN  . Depression Mother   . Fibromyalgia Mother   . Thyroid disease Mother   . Hypertension Mother   . Hypertension Paternal Grandmother   . Cancer Maternal Grandmother        UTERINE  . Thyroid disease Maternal Grandmother     Review of Systems  Constitutional: Negative.   HENT: Negative.   Eyes: Negative.   Respiratory: Negative.   Cardiovascular: Negative.   Gastrointestinal: Negative.   Endocrine: Negative.   Genitourinary: Negative.   Musculoskeletal: Negative.   Skin: Negative.   Allergic/Immunologic: Negative.   Neurological: Negative.   Hematological: Negative.   Psychiatric/Behavioral: Negative.   BM every other day, lots of water, greens, magnesium, herbs. Not currently straining.   Exam:   BP 112/62 (BP Location: Right Arm,  Patient Position: Sitting, Cuff Size: Normal)   Pulse 72   Temp 98.3 F (36.8 C) (Skin)   Ht 5' 3.78" (1.62 m)   Wt 121 lb (54.9 kg)   BMI 20.91 kg/m   Weight change: @WEIGHTCHANGE @ Height:   Height: 5' 3.78" (162 cm)  Ht Readings from Last 3 Encounters:  11/16/18 5' 3.78" (1.62 m)  08/05/11 5\' 4"  (1.626 m)  07/12/11 5\' 4"  (1.626 m)    General appearance: alert, cooperative and appears stated age Head: Normocephalic, without obvious abnormality, atraumatic Neck: no adenopathy, supple, symmetrical, trachea midline and thyroid normal to inspection and palpation Lungs: clear to auscultation bilaterally Cardiovascular: regular rate and rhythm Breasts: normal appearance, no masses or tenderness, bilateral implants.  Abdomen: soft, non-tender; non  distended,  no masses,  no organomegaly Extremities: extremities normal, atraumatic, no cyanosis or edema Skin: Skin color, texture, turgor normal. No rashes or lesions Lymph nodes: Cervical, supraclavicular, and axillary nodes normal. No abnormal inguinal nodes palpated Neurologic: Grossly normal   Pelvic: External genitalia:  no lesions              Urethra:  normal appearing urethra with no masses, tenderness or lesions              Bartholins and Skenes: normal                 Vagina: normal appearing vagina with normal color and discharge, no lesions. Grade 1-2 cystocele and rectocele.               Cervix: no lesions               Bimanual Exam:  Uterus:  normal size, contour, position, consistency, mobility, non-tender and anteverted              Adnexa: no mass, fullness, tenderness               Rectovaginal: Confirms               Anus:  normal sphincter tone, no lesions  Chaperone was present for exam.  A:  Well Woman with normal exam  FH of ovarian cancer   Mild genital prolapse, information given  Low libido, information given  P:   No pap this year  Discussed breast self exam  Discussed calcium and vit D intake   Screening labs  She will get more information from her mom   Will set up referral to genetics     Addendum: IUD strings 1-2 cm, IUD removed with ringed forceps.

## 2018-11-17 LAB — LIPID PANEL
Chol/HDL Ratio: 2.6 ratio (ref 0.0–4.4)
Cholesterol, Total: 185 mg/dL (ref 100–199)
HDL: 72 mg/dL (ref >39)
LDL Calculated: 102 mg/dL — ABNORMAL HIGH (ref 0–99)
Triglycerides: 56 mg/dL (ref 0–149)
VLDL Cholesterol Cal: 11 mg/dL (ref 5–40)

## 2018-11-17 LAB — COMPREHENSIVE METABOLIC PANEL
ALT: 19 IU/L (ref 0–32)
AST: 25 IU/L (ref 0–40)
Albumin/Globulin Ratio: 2 (ref 1.2–2.2)
Albumin: 4.5 g/dL (ref 3.8–4.8)
Alkaline Phosphatase: 65 IU/L (ref 39–117)
BUN/Creatinine Ratio: 7 — ABNORMAL LOW (ref 9–23)
BUN: 6 mg/dL (ref 6–24)
Bilirubin Total: 0.7 mg/dL (ref 0.0–1.2)
CO2: 25 mmol/L (ref 20–29)
Calcium: 8.9 mg/dL (ref 8.7–10.2)
Chloride: 99 mmol/L (ref 96–106)
Creatinine, Ser: 0.86 mg/dL (ref 0.57–1.00)
GFR calc Af Amer: 97 mL/min/{1.73_m2} (ref >59)
GFR calc non Af Amer: 84 mL/min/{1.73_m2} (ref >59)
Globulin, Total: 2.2 g/dL (ref 1.5–4.5)
Glucose: 89 mg/dL (ref 65–99)
Potassium: 4.1 mmol/L (ref 3.5–5.2)
Sodium: 139 mmol/L (ref 134–144)
Total Protein: 6.7 g/dL (ref 6.0–8.5)

## 2018-11-17 LAB — CBC
Hematocrit: 39.5 % (ref 34.0–46.6)
Hemoglobin: 12.8 g/dL (ref 11.1–15.9)
MCH: 30.4 pg (ref 26.6–33.0)
MCHC: 32.4 g/dL (ref 31.5–35.7)
MCV: 94 fL (ref 79–97)
Platelets: 253 10*3/uL (ref 150–450)
RBC: 4.21 x10E6/uL (ref 3.77–5.28)
RDW: 12.9 % (ref 11.7–15.4)
WBC: 7 10*3/uL (ref 3.4–10.8)

## 2018-11-24 ENCOUNTER — Telehealth: Payer: Self-pay | Admitting: Genetic Counselor

## 2018-11-24 NOTE — Telephone Encounter (Signed)
Received a genetic counseling referral from Dr. Talbert Nan for fhx of breast and uterine cancer. Ms. Frickey has been cld and scheduled to see Raquel Sarna on 10/29 at 10am. Pt declined a webex visit and preferred an in person visit.

## 2018-12-24 ENCOUNTER — Other Ambulatory Visit: Payer: Self-pay | Admitting: Emergency Medicine

## 2018-12-24 DIAGNOSIS — Z20822 Contact with and (suspected) exposure to covid-19: Secondary | ICD-10-CM

## 2018-12-25 LAB — NOVEL CORONAVIRUS, NAA: SARS-CoV-2, NAA: NOT DETECTED

## 2019-01-20 ENCOUNTER — Telehealth: Payer: Self-pay | Admitting: Genetic Counselor

## 2019-01-20 NOTE — Telephone Encounter (Signed)
Called pt per 10/28 sch message - no answer . Left message for patient to call back to reschedule if still needed.

## 2019-01-21 ENCOUNTER — Inpatient Hospital Stay: Payer: 59

## 2019-01-21 ENCOUNTER — Inpatient Hospital Stay: Payer: 59 | Attending: Genetic Counselor | Admitting: Genetic Counselor

## 2019-01-21 ENCOUNTER — Other Ambulatory Visit: Payer: Self-pay | Admitting: Genetic Counselor

## 2019-01-21 DIAGNOSIS — Z8041 Family history of malignant neoplasm of ovary: Secondary | ICD-10-CM

## 2019-07-29 ENCOUNTER — Telehealth: Payer: Self-pay | Admitting: Internal Medicine

## 2019-07-29 NOTE — Telephone Encounter (Signed)
If we use a ILD held slot, first avail appt would be 5/20. If we use a regular clinic slot, first avail would be 6/30.

## 2019-07-29 NOTE — Telephone Encounter (Signed)
[name] is the wife of DR [facility] rheumatologist. He called yesterday . Cynthia Dodson has dyspnea. Requesting I see her - 30 min slot. When is first available? Next few weeks

## 2019-07-30 NOTE — Telephone Encounter (Signed)
ATC patient LMTCB. Per Dr. Marchelle Gearing she can be put on his schedule for 08/12/19 pm slot - 30 min. She can go in the 3:30 slot. Will wait to hear back from patient

## 2019-07-30 NOTE — Telephone Encounter (Signed)
Called and spoke with patient she is now scheduled with MR on 5/25. Nothing further needed at this time.

## 2019-07-30 NOTE — Telephone Encounter (Signed)
You can give 08/12/19 pm slot - 30 min. I just need to have my last appt at 3.30/4pm. My potntial meeting is at 5

## 2019-08-17 ENCOUNTER — Other Ambulatory Visit: Payer: Self-pay

## 2019-08-17 ENCOUNTER — Ambulatory Visit: Payer: 59 | Admitting: Internal Medicine

## 2019-08-17 ENCOUNTER — Telehealth: Payer: Self-pay | Admitting: Internal Medicine

## 2019-08-17 ENCOUNTER — Encounter: Payer: Self-pay | Admitting: Internal Medicine

## 2019-08-17 VITALS — BP 126/74 | HR 63 | Temp 98.5°F | Ht 63.78 in | Wt 125.4 lb

## 2019-08-17 DIAGNOSIS — R0600 Dyspnea, unspecified: Secondary | ICD-10-CM

## 2019-08-17 DIAGNOSIS — R059 Cough, unspecified: Secondary | ICD-10-CM

## 2019-08-17 DIAGNOSIS — R053 Chronic cough: Secondary | ICD-10-CM

## 2019-08-17 DIAGNOSIS — J387 Other diseases of larynx: Secondary | ICD-10-CM

## 2019-08-17 DIAGNOSIS — R05 Cough: Secondary | ICD-10-CM | POA: Diagnosis not present

## 2019-08-17 NOTE — Telephone Encounter (Signed)
I left a message for the patient Cynthia Dodson  but please do tell her to stop the fish oil for a few months.  Fish oil currently does not seem to have beneficial effects on the heart.  Pleasant anecdotal personal experience it can contribute to acid reflux and can potentially explain why she coughs more when she lies down and why Prilosec seem to have helped.  She should definitely stop fish oil for at least another 2 or 3 months

## 2019-08-17 NOTE — Progress Notes (Signed)
OV 08/17/2019  Subjective:  Patient ID: Cynthia Dodson, female , DOB: Oct 22, 1975 , age 44 y.o. , MRN: [id number] , ADDRESS: [address] [place] [address]  PCP Martha Clan, MD [facility] - Cynthia Aris [place]    08/17/2019 -   Chief Complaint  Patient presents with  . Pulmonary Consult    unable to get a full breathe, mucus at the back of her throat, cough      HPI Cynthia Dodson 44 y.o. 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 Mother of three kids and wife to Cynthia. Alben Dodson rheumatologist in town. She presents for new evaluation of shortness of breath and throat clearing/chronic cough. According to history insidious onset of shortness of breath 18 months ago. She feels she needs to take a deep breath. And she also feels she cannot take an adequate breath. There is no impairment to ability to do physical activity such as power yoga and she notices no limitation of very little limitation with activities. She feels like she needs to sigh at times to get her breath. This has been followed by throat clearing and chronic cough for the last 8 months or so. She feels there is mucus in the back of the throat but today she has not been able to bring out active mucus. She has a chronically constantly clear the throat. Symptoms are rated as moderate to severe. Symptoms are made worse particularly at night when she tries to go to sleep but it does not wake her up in the middle of the night. When she does loud reading her voice sounds hoarse. RSI cough scores reflected below and suggest irritable larynx syndrome.  Cough related risk factors -No known spring allergies -Not on ACE inhibitor -Noted to be on fish oil but I picked this up only after she left the office -No known acid reflux although Prilosec has helped -No known asthma -She think she has a history of Raynaud but no known connective tissue disease  Course -December 2020 so primary care physician Cynthia. Clelia Croft. At that time for 1  month she got a course of albuterol daily Flonase and Xyzal. Chest x-ray at the time was clear but this did not relieve symptoms. She was then referred to Cynthia. Aris [place]. During this time she gave up coffee and chocolate for a time-limited trial but this did not help relieve his symptoms. Pulmonary function test could not be done by Cynthia. Gary Fleet because of COVID-19 pandemic. She had allergy testing which was positive for dust and trees but otherwise largely negative. She tried nasal antihistamine spray, Symbicort and Xyzal for 2 weeks without relief. By this time March 2021 rolled around she tried Prilosec 40 mg for 2 weeks after talking to her husband and she got some relief. She also started trying [place] acupuncture and Congo herbs for the last 2 months and she has been got Loss adjuster, chartered from voice rehab speech therapist Cynthia Dodson at [facility] for the last 2 weeks. Because of these in the last 3 weeks he is beginning to feel better. But she still has significant amount of symptoms as documented below.   Cynthia Gretta Cool Reflux Symptom Index (> 13-15 suggestive of LPR cough) 0 -> 5  =  none ->severe problem 08/17/2019   Hoarseness of problem with voice 3  Clearing  Of Throat 5  Excess throat mucus or feeling of post nasal drip 5  Difficulty swallowing food, liquid or tablets 1  Cough after eating or  lying down 4  Breathing difficulties or choking episodes 3  Troublesome or annoying cough 4  Sensation of something sticking in throat or lump in throat 4  Heartburn, chest pain, indigestion, or stomach acid coming up 1  TOTAL 30      ROS - per HPI     has a past medical history of Cystitis, interstitial, Headache(784.0), and Migraine with aura.   reports that she has never smoked. She has never used smokeless tobacco.  Past Surgical History:  Procedure Laterality Date  . BREAST SURGERY    . POLYPECTOMY    . TONSILLECTOMY    . WISDOM TOOTH EXTRACTION      No Known Allergies  There is no  immunization history for the selected administration types on file for this patient.  Family History  Problem Relation Age of Onset  . Cancer Mother        OVARIAN  . Depression Mother   . Fibromyalgia Mother   . Thyroid disease Mother   . Hypertension Mother   . Hypertension Paternal Grandmother   . Cancer Maternal Grandmother        UTERINE  . Thyroid disease Maternal Grandmother      Current Outpatient Medications:  .  Ascorbic Acid (VITAMIN C PO), Take by mouth., Disp: , Rfl:  .  fish oil-omega-3 fatty acids 1000 MG capsule, Take 2 g by mouth daily., Disp: , Rfl:  .  Multiple Vitamin (MULTIVITAMIN PO), Take by mouth., Disp: , Rfl:  .  Multiple Vitamins-Minerals (ZINC PO), Take by mouth., Disp: , Rfl:  .  VITAMIN D PO, Take by mouth., Disp: , Rfl:   Social hx - Hydrologist. Husband is Cynthia Dodson rheumatologist.    Objective:   Vitals:   08/17/19 1214  BP: 126/74  Pulse: 63  Temp: 98.5 F (36.9 C)  TempSrc: Temporal  SpO2: 98%  Weight: 125 lb 6.4 oz (56.9 kg)  Height: 5' 3.78" (1.62 m)    Estimated body mass index is 21.67 kg/m as calculated from the following:   Height as of this encounter: 5' 3.78" (1.62 m).   Weight as of this encounter: 125 lb 6.4 oz (56.9 kg).  @WEIGHTCHANGE @  [employer]   08/17/19 1214  Weight: 125 lb 6.4 oz (56.9 kg)     Physical Exam  General Appearance:    Alert, cooperative, no distress, appears stated age - yes , Deconditioned looking - no , OBESE  - no, Sitting on Wheelchair -  no  Head:    Normocephalic, without obvious abnormality, atraumatic  Eyes:    PERRL, conjunctiva/corneas clear,  Ears:    Normal TM's and external ear canals, both ears  Nose:   Nares normal, septum midline, mucosa normal, no drainage    or sinus tenderness. OXYGEN ON  - no . Patient is @ ra   Throat:   Lips, mucosa, and tongue normal; teeth and gums normal. Cyanosis on lips - no  Neck:   Supple, symmetrical, trachea  midline, no adenopathy;    thyroid:  no enlargement/tenderness/nodules; no carotid   bruit or JVD  Back:     Symmetric, no curvature, ROM normal, no CVA tenderness  Lungs:     Distress - no , Wheeze no, Barrell Chest - no, Purse lip breathing - no, Crackles - no   Chest Wall:    No tenderness or deformity.    Heart:    Regular rate and rhythm, S1 and S2  normal, no rub   or gallop, Murmur - no  Breast Exam:    NOT DONE  Abdomen:     Soft, non-tender, bowel sounds active all four quadrants,    no masses, no organomegaly. Visceral obesity - no  Genitalia:   NOT DONE  Rectal:   NOT DONE  Extremities:   Extremities - normal, Has Cane - no, Clubbing - no, Edema - no  Pulses:   2+ and symmetric all extremities  Skin:   Stigmata of Connective Tissue Disease - no STIGMATA of CONNECTIVE TISSUE DISEASE  - Distal digital fissuring (ie, "mechanic hands") - no - Distal digital tip ulceration - no -Inflammatory arthritis or polyarticular morning joint stiffness ?60 minutes - no - Palmar telangiectasia - no - Raynaud phenomenon - No but has hx - Unexplained digital edema - no - Unexplained fixed rash on the digital extensor surfaces (Gottron's sign) - no ... - Deformities of RA - no - Scleroderma  - no - Malar Rash -  no   Lymph nodes:   Cervical, supraclavicular, and axillary nodes normal  Psychiatric:  Neurologic:   Pleasant - yes, Anxious - no, Flat affect - no  CAm-ICU - neg, Alert and Oriented x 3 - yes, Moves all 4s - yes, Speech - normal, Cognition - intact           Assessment:       ICD-10-CM   1. Chronic cough  R05 CT Chest High Resolution    Pulmonary function test  2. Dyspnea, unspecified type  R06.00 CT Chest High Resolution    Pulmonary function test  3. Irritable larynx  J38.7 Pulmonary function test  4. Cough  R05 CT MAXILLOFACIAL WO CONTRAST   She has significant features of chronic cough. Cough neuropathy/irritable larynx or laryngopharyngeal reflux seems to be  that the innominate problem here given the fact she is clearing her throat. Symptoms get worse when she lies down suggest acid reflux or postnasal drip particularly acid reflux in the presence of fish oil. The fact is not disturbed at night but she is clearing the throat and other features suggest cough neuropathy. The improvement with acupuncture could be coincidental but acupuncture is known to mediate pain receptors which is a significant feature and chronic refractory cough.  Nevertheless because of the long duration of symptoms she is interested in going through work-up diagnostic for this. We'll get high-resolution CT chest, CT sinus exam nitric oxide testing and full pulmonary function test.  Overall she is interested in a nonpharmaceutical approach to her care. Based on the results we can discuss this in approaches. Certainly if if it is all chronic cough neuropathy then voice rehabilitation voice rest and acupuncture might be the way to go initially. Gabapentin will be added as a backup option.  She is in full agreement with the plan.    Plan:     Patient Instructions     ICD-10-CM   1. Chronic cough  R05   2. Dyspnea, unspecified type  R06.00   3. Irritable larynx  J38.7     Symptoms appear classic for cough neuropathy aka irritable larynx or chronic refractory cough. Glad acupuncutre is helping. Need to tests to look for underlying causes (if any)  Plan  - do HRCT sinus without contrast  - do HRCT supine and prone  - do PFT full  - do Exhaled NO test  Followup - 15 min tele visit after above in next few to several weeks to  discuss above results and next step   ( Level 05 visit:  New 60-74 min   visit type: on-site physical face to visit  in total care time and counseling or/and coordination of care by this undersigned MD - Cynthia Kalman Shan. This includes one or more of the following on this same day 08/17/2019: pre-charting, chart review, note writing, documentation  discussion of test results, diagnostic or treatment recommendations, prognosis, risks and benefits of management options, instructions, education, compliance or risk-factor reduction. It excludes time spent by the CMA or office staff in the care of the patient. Actual time 60 min)   SIGNATURE    Cynthia. Kalman Shan, M.D., F.C.C.P,  Pulmonary and Critical Care Medicine Staff Physician, [facility] Director - Interstitial Lung Disease  Program  Pulmonary Fibrosis [facility] at [facility] [place], [place], [address]  Pager: [phone], If no answer or between  15:00h - 7:00h: call [phone] Telephone: [phone]  12:46 PM 08/17/2019

## 2019-08-17 NOTE — Telephone Encounter (Signed)
Attempted to call pt but unable to reach. Left message for her to return call. 

## 2019-08-17 NOTE — Patient Instructions (Signed)
ICD-10-CM   1. Chronic cough  R05   2. Dyspnea, unspecified type  R06.00   3. Irritable larynx  J38.7     Symptoms appear classic for cough neuropathy aka irritable larynx or chronic refractory cough. Glad acupuncutre is helping. Need to tests to look for underlying causes (if any)  Plan  - do HRCT sinus without contrast  - do HRCT supine and prone  - do PFT full  - do Exhaled NO test  Followup - 15 min tele visit after above in next few to several weeks to discuss above results and next step

## 2019-08-20 NOTE — Telephone Encounter (Signed)
Pt returning call.  [phone].

## 2019-08-20 NOTE — Telephone Encounter (Signed)
Attempted to call pt but unable to reach. Left message for her to return call. 

## 2019-09-07 ENCOUNTER — Other Ambulatory Visit: Payer: Self-pay

## 2019-09-07 ENCOUNTER — Telehealth: Payer: Self-pay | Admitting: Internal Medicine

## 2019-09-07 ENCOUNTER — Ambulatory Visit (INDEPENDENT_AMBULATORY_CARE_PROVIDER_SITE_OTHER)
Admission: RE | Admit: 2019-09-07 | Discharge: 2019-09-07 | Disposition: A | Discharge status: Home / Self Care | Payer: 59 | Source: Ambulatory Visit | Attending: Internal Medicine | Admitting: Internal Medicine

## 2019-09-07 DIAGNOSIS — R053 Chronic cough: Secondary | ICD-10-CM

## 2019-09-07 DIAGNOSIS — R05 Cough: Secondary | ICD-10-CM | POA: Diagnosis not present

## 2019-09-07 DIAGNOSIS — R0600 Dyspnea, unspecified: Secondary | ICD-10-CM | POA: Diagnosis not present

## 2019-09-07 DIAGNOSIS — R059 Cough, unspecified: Secondary | ICD-10-CM

## 2019-09-07 NOTE — Telephone Encounter (Signed)
CT sinus and HRCT normal - I called and tried to give result but went to VM. Left message saying CMA will call with result  Plan  - keel up PFT appt mid July 2021  - ensure feno also being tested in that visit     SIGNATURE    Dr. Kalman Shan, M.D., F.C.C.P,  Pulmonary and Critical Care Medicine Staff Physician, [facility] Director - Interstitial Lung Disease  Program  Pulmonary Fibrosis [facility] at [facility] [place], [place], [address]  Pager: [phone], If no answer or between  15:00h - 7:00h: call [phone] Telephone: [phone]  5:00 PM 09/07/2019    PF  CT Chest High Resolution  Result Date: 09/07/2019 CLINICAL DATA:  Chronic nonproductive cough for 9 months with dyspnea on exertion and postnasal drip. EXAM: CT CHEST WITHOUT CONTRAST TECHNIQUE: Multidetector CT imaging of the chest was performed following the standard protocol without intravenous contrast. High resolution imaging of the lungs, as well as inspiratory and expiratory imaging, was performed. COMPARISON:  None. FINDINGS: Cardiovascular: Normal heart size. No significant pericardial effusion/thickening. Great vessels are normal in course and caliber. Mediastinum/Nodes: No discrete thyroid nodules. Unremarkable esophagus. No pathologically enlarged axillary, mediastinal or hilar lymph nodes, noting limited sensitivity for the detection of hilar adenopathy on this noncontrast study. Lungs/Pleura: No pneumothorax. No pleural effusion. No acute consolidative airspace disease, lung masses or significant pulmonary nodules. No significant air trapping or evidence of tracheobronchomalacia on the expiration sequence. No significant regions of subpleural reticulation, ground-glass attenuation, traction bronchiectasis, architectural distortion, parenchymal banding or frank honeycombing. Upper abdomen: No acute abnormality. Musculoskeletal: No aggressive appearing focal osseous  lesions. Bilateral breast prostheses. Mild thoracic spondylosis. IMPRESSION: No evidence of interstitial lung disease. No active pulmonary disease. Electronically Signed   By: Delbert Phenix M.D.   On: 09/07/2019 10:34   CT MAXILLOFACIAL WO CONTRAST  Result Date: 09/07/2019 CLINICAL DATA:  Chronic cough EXAM: CT MAXILLOFACIAL WITHOUT CONTRAST TECHNIQUE: Multidetector CT images of the paranasal sinuses were obtained using the standard protocol without intravenous contrast. COMPARISON:  None. FINDINGS: Paranasal sinuses: Frontal: Normally aerated. Patent frontal sinus drainage pathways. Ethmoid: Normally aerated. Maxillary: Normally aerated. Sphenoid: Normally aerated. Patent sphenoethmoidal recesses. Mastoid: Clear bilaterally. Right ostiomeatal unit: Patent. Left ostiomeatal unit: Patent. Nasal passages: Patent. Intact nasal septum is midline. Anatomy: No acute skeletal abnormality. Limited intracranial imaging negative Negative orbit Keros type 3 olfactory recess. IMPRESSION: Normally aerated paranasal sinuses.  Patent sinus drainage pathways. Electronically Signed   By: Marlan Palau M.D.   On: 09/07/2019 14:37

## 2019-09-08 ENCOUNTER — Other Ambulatory Visit: Payer: 59

## 2019-09-08 NOTE — Telephone Encounter (Signed)
Pt returned call. I stated to her the results of CT scans per MR and stated to her to keep PFT appt as scheduled. Pt verbalized understanding. Nothing further needed.

## 2019-09-08 NOTE — Telephone Encounter (Signed)
It does state for pt to have Feno when come in for PFT. Order for Feno had not been placed so I have taken care of doing this.  Attempted to call pt but line went straight to VM. Left message for her to return call.

## 2019-10-04 ENCOUNTER — Other Ambulatory Visit (HOSPITAL_COMMUNITY)
Admission: RE | Admit: 2019-10-04 | Discharge: 2019-10-04 | Disposition: A | Discharge status: Home / Self Care | Payer: 59 | Source: Ambulatory Visit | Attending: Adult Health | Admitting: Adult Health

## 2019-10-04 DIAGNOSIS — Z20822 Contact with and (suspected) exposure to covid-19: Secondary | ICD-10-CM | POA: Insufficient documentation

## 2019-10-04 LAB — SARS CORONAVIRUS 2 (TAT 6-24 HRS): SARS Coronavirus 2: NEGATIVE

## 2019-10-07 ENCOUNTER — Ambulatory Visit (INDEPENDENT_AMBULATORY_CARE_PROVIDER_SITE_OTHER): Payer: 59 | Admitting: Internal Medicine

## 2019-10-07 ENCOUNTER — Other Ambulatory Visit: Payer: Self-pay

## 2019-10-07 DIAGNOSIS — R0600 Dyspnea, unspecified: Secondary | ICD-10-CM

## 2019-10-07 DIAGNOSIS — R053 Chronic cough: Secondary | ICD-10-CM

## 2019-10-07 DIAGNOSIS — R05 Cough: Secondary | ICD-10-CM | POA: Diagnosis not present

## 2019-10-07 DIAGNOSIS — J387 Other diseases of larynx: Secondary | ICD-10-CM

## 2019-10-07 LAB — NITRIC OXIDE: Nitric Oxide: 9

## 2019-10-07 NOTE — Progress Notes (Signed)
PFT done today. 

## 2019-10-08 ENCOUNTER — Encounter: Payer: Self-pay | Admitting: Adult Health

## 2019-10-08 ENCOUNTER — Ambulatory Visit (INDEPENDENT_AMBULATORY_CARE_PROVIDER_SITE_OTHER): Payer: 59 | Admitting: Adult Health

## 2019-10-08 DIAGNOSIS — J387 Other diseases of larynx: Secondary | ICD-10-CM

## 2019-10-08 NOTE — Progress Notes (Signed)
Virtual Visit via Telephone Note  I connected with Cynthia Dodson on 10/08/19 at  1:30 PM EDT by telephone and verified that I am speaking with the correct person using two identifiers.  Location: Patient: [facility]  Provider: Office    I discussed the limitations, risks, security and privacy concerns of performing an evaluation and management service by telephone and the availability of in person appointments. I also discussed with the patient that there may be a patient responsible charge related to this service. The patient expressed understanding and agreed to proceed.   History of Present Illness: 44 year old female never smoker seen for pulmonary consult May 2021 for ongoing cough shortness of breath and throat clearing over the last 2 years.  Today's televisit is a 6-week follow-up for cough and shortness of breath.  Patient is a very active Marine scientist.  Has noticed over the last 2 years she has had shortness of breath feeling that she cannot take in a deep breath, recurrent cough and throat clearing.  Increased mucus in the back of her throat that impedes her breathing at times.  Patient was set up for pulmonary function testing which was done October 07, 2019 that showed normal lung function with FEV1 at 88%, ratio is 73, FVC 99%, DLCO 93%, no significant bronchodilator response.  She was set up for a CT high-resolution chest and sinus CT.  These were done September 07, 2019 and showed normal lungs with no evidence of interstitial lung disease or acute process.  Sinuses were clear with no chronic or acute sinusitis. Exhaled nitric oxide testing was normal at 9. Patient has tried Flonase, Xyzal and albuterol without any perceived benefit.  She continues to have ongoing episodes of throat clearing recurrent cough.  She feels that something is going on with her vocal cords and possibly she might have vocal cord dysfunction.  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   Patient Active Problem List   Diagnosis Date Noted  . 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 Migraine with aura   . Cystitis, interstitial   . Hx of migraines    Current Outpatient Medications on File Prior to Visit  Medication Sig Dispense Refill  . Ascorbic Acid (VITAMIN C PO) Take by mouth.    . fish oil-omega-3 fatty acids 1000 MG capsule Take 2 g by mouth daily.    . Multiple Vitamin (MULTIVITAMIN PO) Take by mouth.    . Multiple Vitamins-Minerals (ZINC PO) Take by mouth.    Marland Kitchen VITAMIN D PO Take by mouth.     No current facility-administered medications on file prior to visit.    Observations/Objective: Speaks in full sentences with no audible distress  Assessment and Plan: Upper airway cough syndrome-possible underlying vocal cord dysfunction-we will add Chlor-Trimeton at bedtime for possible postnasal drainage.  Add saline nasal spray and saline nasal gel for nasal congestion.  And postnasal drip.  Add Delsym for cough control.  Patient will be referred to ENT for evaluation of recurrent throat clearing.  If ENT exam is unrevealing.  Will refer to wait for speech center for possible vocal cord dysfunction  Dyspnea questionable etiology.  Pulmonary function testing is normal with no airflow obstruction or restriction.  May have a component of reactive airways or mild intermittent asthma however had no perceived benefit with albuterol CT chest and sinuses were normal.  Plan  Patient Instructions  Referral to ENT - throat clearing .  Begin Chlortrimeton 4mg  At bedtime.  Saline nasal spray Twice daily   Saline nasal gel At bedtime .  Sips of water to soothe throat and prevent cough/throat clearing  Delsym 2 tsp Twice daily As needed   Follow up with Dr. in 2 months and As needed        Follow Up Instructions: Follow-up in 2 months and as needed   I discussed the assessment and treatment plan with  the patient. The patient was provided an opportunity to ask questions and all were answered. The patient agreed with the plan and demonstrated an understanding of the instructions.   The patient was advised to call back or seek an in-person evaluation if the symptoms worsen or if the condition fails to improve as anticipated.  I provided 22  minutes of non-face-to-face time during this encounter.   Marchelle Gearing, NP

## 2019-10-08 NOTE — Patient Instructions (Signed)
Referral to ENT - throat clearing .  Begin Chlortrimeton 4mg  At bedtime.  Saline nasal spray Twice daily   Saline nasal gel At bedtime .  Sips of water to soothe throat and prevent cough/throat clearing  Delsym 2 tsp Twice daily As needed   Follow up with Dr. in 2 months and As needed

## 2019-11-15 NOTE — Progress Notes (Signed)
44 y.o. [id number] Married White or Caucasian Not Hispanic or Latino female here for annual exam.  She has noticed that she is having headaches about 2 days before she is due to start her period. She has been working with a [place] medicine specialist to help clear up what could be vocal cord dysfunction. She symptoms are excessive mucous at the base of her throat, coughing, inability to get a full breath. She has seen her primary, pulmonology, allergist. Next step is to see an ENT.   She has been feeling better in the last 3 month after working with a [place] [facility] specialist with herbs and acupuncture.   For several months she is having migraines with aura a few days prior to her cycle. Sometimes she can rest and they resolve.   She says that she feels like she has some prolapse. Last year she was noted to have a grade 1-2 cystocele and rectocele. She doesn't think it's worse. She notices if she has been on her feet all day long, then she has a pelvic heaviness. Doesn't feel she has a strong kegel. She doesn't notice a bulge.   She has mild GSI. Sometimes she needs to double void to empty her bladder. She drinks a lot of water, voids frequently, normal amounts.  She always has constipation issues (whole life). Has a BM every couple of days (used to be a week).   Sexually active, no dyspareunia.   Period Cycle (Days): 28 Period Duration (Days): 5 Period Pattern: Regular Menstrual Flow: Light, Moderate Menstrual Control: Tampon, Thin pad, Panty liner Menstrual Control Change Freq (Hours): 6 Dysmenorrhea: (!) Mild Dysmenorrhea Symptoms: Cramping, Headache   Mom had ovarian cancer in her 30's, she had a hysterectomy and BSO. Never had chemotherapy. Mom had negative genetic testing.  MGM with uterine cancer, also young. Died at 72.   Patient's last menstrual period was 11/02/2019.          Sexually active: Yes.    The current method of family planning is vasectomy.    Exercising: Yes.     Yoga walking  Smoker:  no  Health Maintenance: Pap:  12/05/17 negative, negative hpv  History of abnormal Pap:  no MMG:  12/04/17 Bi-rads 2 benign  BMD:   None  Colonoscopy: 2010 WNL per patient (life long constipation) TDaP:  Unsure  Gardasil: no, desires.     reports that she has never smoked. She has never used smokeless tobacco. She reports current alcohol use. She reports that she does not use drugs. 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 Husband is a Publishing rights manager. Kids are 8, 79, 15.Oldest is a girl, younger 2 are boys.  Past Medical History:  Diagnosis Date  . Cystitis, interstitial   . Headache(784.0)   . Migraine with aura     Past Surgical History:  Procedure Laterality Date  . BREAST SURGERY    . POLYPECTOMY    . TONSILLECTOMY    . WISDOM TOOTH EXTRACTION      Current Outpatient Medications  Medication Sig Dispense Refill  . Multiple Vitamin (MULTIVITAMIN PO) Take by mouth.    . Multiple Vitamins-Minerals (ZINC PO) Take by mouth.    Marland Kitchen VITAMIN D PO Take by mouth.     No current facility-administered medications for this visit.    Family History  Problem Relation Age of Onset  . Cancer Mother        OVARIAN  . Depression Mother   . Fibromyalgia Mother   .  Thyroid disease Mother   . Hypertension Mother   . Hypertension Paternal Grandmother   . Cancer Maternal Grandmother        UTERINE  . Thyroid disease Maternal Grandmother     Review of Systems  All other systems reviewed and are negative.   Exam:   BP 118/62   Pulse 70   Ht 5\' 4"  (1.626 m)   Wt 121 lb (54.9 kg)   LMP 11/02/2019   BMI 20.77 kg/m   Weight change: @WEIGHTCHANGE @ Height:   Height: 5\' 4"  (162.6 cm)  Ht Readings from Last 3 Encounters:  11/17/19 5\' 4"  (1.626 m)  08/17/19 5' 3.78" (1.62 m)  11/16/18 5' 3.78" (1.62 m)    General appearance: alert, cooperative and appears stated age Head: Normocephalic, without obvious abnormality, atraumatic Neck: no adenopathy, supple,  symmetrical, trachea midline and thyroid normal to inspection and palpation Lungs: clear to auscultation bilaterally Cardiovascular: regular rate and rhythm Breasts: normal appearance, no masses or tenderness, bilateral implants Abdomen: soft, non-tender; non distended,  no masses,  no organomegaly Extremities: extremities normal, atraumatic, no cyanosis or edema Skin: Skin color, texture, turgor normal. No rashes or lesions Lymph nodes: Cervical, supraclavicular, and axillary nodes normal. No abnormal inguinal nodes palpated Neurologic: Grossly normal   Pelvic: External genitalia:  no lesions              Urethra:  normal appearing urethra with no masses, tenderness or lesions              Bartholins and Skenes: normal                 Vagina: normal appearing vagina with normal color and discharge, no lesions              Cervix: no lesions               Bimanual Exam:  Uterus:  normal size, contour, position, consistency, mobility, non-tender and anteverted              Adnexa: no mass, fullness, tenderness               Rectovaginal: Confirms               Anus:  normal sphincter tone, no lesions  chaperoned for the exam.  A:  Well Woman with normal exam  FH of ovarian cancer and uterine cancer  Long term constipation  Genital prolapse, small grade 2 cystocele, grade 1-2 rectocele  Mild GSI  Vit d def in her diet  P:   No pap this year  Referral to genetics placed  Screening labs, vit d, TSH  Mammogram # given, she will schedule  Discussed kegels and not straining  Information on prolapse given (ACOG and Epic)  Gadasil series started, information given  TDAP today

## 2019-11-16 LAB — PULMONARY FUNCTION TEST
DL/VA % pred: 100 %
DL/VA: 4.44 ml/min/mmHg/L
DLCO cor % pred: 93 %
DLCO cor: 20.43 ml/min/mmHg
DLCO unc % pred: 93 %
DLCO unc: 20.43 ml/min/mmHg
FEF 25-75 Post: 1.52 L/sec
FEF 25-75 Pre: 1.75 L/sec
FEF2575-%Change-Post: -13 %
FEF2575-%Pred-Post: 49 %
FEF2575-%Pred-Pre: 56 %
FEV1-%Change-Post: -9 %
FEV1-%Pred-Post: 80 %
FEV1-%Pred-Pre: 88 %
FEV1-Post: 2.4 L
FEV1-Pre: 2.66 L
FEV1FVC-%Change-Post: -9 %
FEV1FVC-%Pred-Pre: 88 %
FEV6-%Change-Post: 0 %
FEV6-%Pred-Post: 100 %
FEV6-%Pred-Pre: 100 %
FEV6-Post: 3.64 L
FEV6-Pre: 3.65 L
FEV6FVC-%Pred-Post: 101 %
FEV6FVC-%Pred-Pre: 101 %
FVC-%Change-Post: 0 %
FVC-%Pred-Post: 98 %
FVC-%Pred-Pre: 99 %
FVC-Post: 3.64 L
FVC-Pre: 3.65 L
Post FEV1/FVC ratio: 66 %
Post FEV6/FVC ratio: 100 %
Pre FEV1/FVC ratio: 73 %
Pre FEV6/FVC Ratio: 100 %
RV % pred: 104 %
RV: 1.69 L
TLC % pred: 104 %
TLC: 5.29 L

## 2019-11-17 ENCOUNTER — Other Ambulatory Visit: Payer: Self-pay

## 2019-11-17 ENCOUNTER — Ambulatory Visit: Payer: 59 | Admitting: Obstetrics and Gynecology

## 2019-11-17 ENCOUNTER — Encounter: Payer: Self-pay | Admitting: Obstetrics and Gynecology

## 2019-11-17 VITALS — BP 118/62 | HR 70 | Ht 64.0 in | Wt 121.0 lb

## 2019-11-17 DIAGNOSIS — N8111 Cystocele, midline: Secondary | ICD-10-CM

## 2019-11-17 DIAGNOSIS — G43109 Migraine with aura, not intractable, without status migrainosus: Secondary | ICD-10-CM

## 2019-11-17 DIAGNOSIS — Z23 Encounter for immunization: Secondary | ICD-10-CM | POA: Diagnosis not present

## 2019-11-17 DIAGNOSIS — E559 Vitamin D deficiency, unspecified: Secondary | ICD-10-CM

## 2019-11-17 DIAGNOSIS — Z8049 Family history of malignant neoplasm of other genital organs: Secondary | ICD-10-CM | POA: Diagnosis not present

## 2019-11-17 DIAGNOSIS — Z7189 Other specified counseling: Secondary | ICD-10-CM

## 2019-11-17 DIAGNOSIS — Z01419 Encounter for gynecological examination (general) (routine) without abnormal findings: Secondary | ICD-10-CM | POA: Diagnosis not present

## 2019-11-17 DIAGNOSIS — K5900 Constipation, unspecified: Secondary | ICD-10-CM

## 2019-11-17 DIAGNOSIS — N393 Stress incontinence (female) (male): Secondary | ICD-10-CM

## 2019-11-17 DIAGNOSIS — Z8041 Family history of malignant neoplasm of ovary: Secondary | ICD-10-CM

## 2019-11-17 DIAGNOSIS — Z7185 Encounter for immunization safety counseling: Secondary | ICD-10-CM

## 2019-11-17 DIAGNOSIS — N816 Rectocele: Secondary | ICD-10-CM

## 2019-11-17 DIAGNOSIS — Z0000 Encounter for general adult medical examination without abnormal findings: Secondary | ICD-10-CM

## 2019-11-17 NOTE — Patient Instructions (Addendum)
EXERCISE AND DIET:  We recommended that you start or continue a regular exercise program for good health. Regular exercise means any activity that makes your heart beat faster and makes you sweat.  We recommend exercising at least 30 minutes per day at least 3 days a week, preferably 4 or 5.  We also recommend a diet low in fat and sugar.  Inactivity, poor dietary choices and obesity can cause diabetes, heart attack, stroke, and kidney damage, among others.    ALCOHOL AND SMOKING:  Women should limit their alcohol intake to no more than 7 drinks/beers/glasses of wine (combined, not each!) per week. Moderation of alcohol intake to this level decreases your risk of breast cancer and liver damage. And of course, no recreational drugs are part of a healthy lifestyle.  And absolutely no smoking or even second hand smoke. Most people know smoking can cause heart and lung diseases, but did you know it also contributes to weakening of your bones? Aging of your skin?  Yellowing of your teeth and nails?  CALCIUM AND VITAMIN D:  Adequate intake of calcium and Vitamin D are recommended.  The recommendations for exact amounts of these supplements seem to change often, but generally speaking 1,000 mg of calcium (between diet and supplement) and 800 units of Vitamin D per day seems prudent. Certain women may benefit from higher intake of Vitamin D.  If you are among these women, your doctor will have told you during your visit.    PAP SMEARS:  Pap smears, to check for cervical cancer or precancers,  have traditionally been done yearly, although recent scientific advances have shown that most women can have pap smears less often.  However, every woman still should have a physical exam from her gynecologist every year. It will include a breast check, inspection of the vulva and vagina to check for abnormal growths or skin changes, a visual exam of the cervix, and then an exam to evaluate the size and shape of the uterus and  ovaries.  And after 42 years of age, a rectal exam is indicated to check for rectal cancers. We will also provide age appropriate advice regarding health maintenance, like when you should have certain vaccines, screening for sexually transmitted diseases, bone density testing, colonoscopy, mammograms, etc.   MAMMOGRAMS:  All women over 40 years old should have a yearly mammogram. Many facilities now offer a "3D" mammogram, which may cost around $50 extra out of pocket. If possible,  we recommend you accept the option to have the 3D mammogram performed.  It both reduces the number of women who will be called back for extra views which then turn out to be normal, and it is better than the routine mammogram at detecting truly abnormal areas.    COLON CANCER SCREENING: Now recommend starting at age 45. At this time colonoscopy is not covered for routine screening until 50. There are take home tests that can be done between 45-49.   COLONOSCOPY:  Colonoscopy to screen for colon cancer is recommended for all women at age 50.  We know, you hate the idea of the prep.  We agree, BUT, having colon cancer and not knowing it is worse!!  Colon cancer so often starts as a polyp that can be seen and removed at colonscopy, which can quite literally save your life!  And if your first colonoscopy is normal and you have no family history of colon cancer, most women don't have to have it again for   10 years.  Once every ten years, you can do something that may end up saving your life, right?  We will be happy to help you get it scheduled when you are ready.  Be sure to check your insurance coverage so you understand how much it will cost.  It may be covered as a preventative service at no cost, but you should check your particular policy.      Breast Self-Awareness Breast self-awareness means being familiar with how your breasts look and feel. It involves checking your breasts regularly and reporting any changes to your  health care provider. Practicing breast self-awareness is important. A change in your breasts can be a sign of a serious medical problem. Being familiar with how your breasts look and feel allows you to find any problems early, when treatment is more likely to be successful. All women should practice breast self-awareness, including women who have had breast implants. How to do a breast self-exam One way to learn what is normal for your breasts and whether your breasts are changing is to do a breast self-exam. To do a breast self-exam: Look for Changes  1. Remove all the clothing above your waist. 2. Stand in front of a mirror in a room with good lighting. 3. Put your hands on your hips. 4. Push your hands firmly downward. 5. Compare your breasts in the mirror. Look for differences between them (asymmetry), such as: ? Differences in shape. ? Differences in size. ? Puckers, dips, and bumps in one breast and not the other. 6. Look at each breast for changes in your skin, such as: ? Redness. ? Scaly areas. 7. Look for changes in your nipples, such as: ? Discharge. ? Bleeding. ? Dimpling. ? Redness. ? A change in position. Feel for Changes Carefully feel your breasts for lumps and changes. It is best to do this while lying on your back on the floor and again while sitting or standing in the shower or tub with soapy water on your skin. Feel each breast in the following way:  Place the arm on the side of the breast you are examining above your head.  Feel your breast with the other hand.  Start in the nipple area and make  inch (2 cm) overlapping circles to feel your breast. Use the pads of your three middle fingers to do this. Apply light pressure, then medium pressure, then firm pressure. The light pressure will allow you to feel the tissue closest to the skin. The medium pressure will allow you to feel the tissue that is a little deeper. The firm pressure will allow you to feel the tissue  close to the ribs.  Continue the overlapping circles, moving downward over the breast until you feel your ribs below your breast.  Move one finger-width toward the center of the body. Continue to use the  inch (2 cm) overlapping circles to feel your breast as you move slowly up toward your collarbone.  Continue the up and down exam using all three pressures until you reach your armpit.  Write Down What You Find  Write down what is normal for each breast and any changes that you find. Keep a written record with breast changes or normal findings for each breast. By writing this information down, you do not need to depend only on memory for size, tenderness, or location. Write down where you are in your menstrual cycle, if you are still menstruating. If you are having trouble noticing differences   in your breasts, do not get discouraged. With time you will become more familiar with the variations in your breasts and more comfortable with the exam. °How often should I examine my breasts? °Examine your breasts every month. If you are breastfeeding, the best time to examine your breasts is after a feeding or after using a breast pump. If you menstruate, the best time to examine your breasts is 5-7 days after your period is over. During your period, your breasts are lumpier, and it may be more difficult to notice changes. °When should I see my health care provider? °See your health care provider if you notice: °· A change in shape or size of your breasts or nipples. °· A change in the skin of your breast or nipples, such as a reddened or scaly area. °· Unusual discharge from your nipples. °· A lump or thick area that was not there before. °· Pain in your breasts. °· Anything that concerns you. ° ° °About Rectocele ° °Overview ° °A rectocele is a type of hernia which causes different degrees of bulging of the rectal tissues into the vaginal wall.  You may even notice that it presses against the vaginal wall so  much that some vaginal tissues droop outside of the opening of your vagina. ° °Causes of Rectocele ° °The most common cause is childbirth.  The muscles and ligaments in the pelvis that hold up and support the female organs and vagina become stretched and weakened during labor and delivery.  The more babies you have, the more the support tissues are stretched and weakened.  Not everyone who has a baby will develop a rectocele.  Some women have stronger supporting tissue in the pelvis and may not have as much of a problem as others.  Women who have a Cesarean section usually do not get rectocele's unless they pushed a long time prior to the cesarean delivery. ° °Other conditions that can cause a rectocele include chronic constipation, a chronic cough, a lot of heavy lifting, and obesity.  Older women may have this problem because the loss of female hormones causes the vaginal tissue to become weaker. ° °Symptoms ° °There may not be any symptoms.  If you do have symptoms, they may include: °· Pelvic pressure in the rectal area °· Protrusion of the lower part of the vagina through the opening of the vagina °· Constipation and trapping of the stool, making it difficult to have a bowel movement.  In severe cases, you may have to press on the lower part of your vagina to help push the stool out of you rectum.  This is called splinting to empty. ° °Diagnosing Rectocele ° °Your health care provider will ask about your symptoms and perform a pelvic exam.  S/he will ask you to bear down, pushing like you are having a bowel movement so as to see how far the lower part of the vagina protrudes into the vagina and possible outside of the vagina.  Your provider will also ask you to contract the muscles of your pelvis (like you are stopping the stream in the middle of urinating) to determine the strength of your pelvic muscles.  Your provider may also do a rectal exam. ° °Treatment Options ° °If you do not have any symptoms, no  treatment may be necessary.  Other treatment options include: °· Pelvic floor exercises: Contracting the muscles in your genital area may help strengthen your muscles and support the organs.  Be sure to   proper exercise instruction from you physical therapist.  A pessary (removealbe pelvic support device) sometimes helps rectocele symptoms.  Surgery: Surgical repair may be necessary. In some cases the uterus may need to be taken out ( a hysterectomy) as well.  There are many types of surgery for pelvic support problems.  Look for physicians who specialize in repair procedures.  You can take care of yourself by:  Treating and preventing constipation  Avoiding heavy lifting, and lifting correctly (with your legs, not with you waist or back)  Treating a chronic cough or bronchitis  Not smoking  avoiding too much weight gain  Doing pelvic floor exercises   2007, [employer] Doc.33About Cystocele  Overview  The pelvic organs, including the bladder, are normally supported by pelvic floor muscles and ligaments.  When these muscles and ligaments are stretched, weakened or torn, the wall between the bladder and the vagina sags or herniates causing a prolapse, sometimes called a cystocele.  This condition may cause discomfort and problems with emptying the bladder.  It can be present in various stages.  Some people are not aware of the changes.  Others may notice changes at the vaginal opening or a feeling of the bladder dropping outside the body.  Causes of a Cystocele  A cystocele is usually caused by muscle straining or stretching during childbirth.  In addition, cystocele is more common after menopause, because the hormone estrogen helps keep the elastic tissues around the pelvic organs strong.  A cystocele is more likely to occur when levels of estrogen decrease.  Other causes include: heavy lifting, chronic coughing, previous pelvic surgery and obesity.  Symptoms  A bladder that  has dropped from its normal position may cause: unwanted urine leakage (stress incontinence), frequent urination or urge to urinate, incomplete emptying of the bladder (not feeling bladder relief after emptying), pain or discomfort in the vagina, pelvis, groin, lower back or lower abdomen and frequent urinary tract infections.  Mild cases may not cause any symptoms.  Treatment Options  Pelvic floor (Kegel) exercises:  Strength training the muscles in your genital area  Behavioral changes: Treating and preventing constipation, taking time to empty your bladder properly, learning to lift properly and/or avoid heavy lifting when possible, stopping smoking, avoiding weight gain and treating a chronic cough or bronchitis.  A pessary: A vaginal support device is sometimes used to help pelvic support caused by muscle and ligament changes.  Surgery: Surgical repair may be necessary if symptoms cannot be managed with exercise, behavioral changes and a pessary.  Surgery is usually considered for severe cases.   2007, [employer]

## 2019-11-18 LAB — COMPREHENSIVE METABOLIC PANEL
ALT: 20 IU/L (ref 0–32)
AST: 29 IU/L (ref 0–40)
Albumin/Globulin Ratio: 2 (ref 1.2–2.2)
Albumin: 4.7 g/dL (ref 3.8–4.8)
Alkaline Phosphatase: 58 IU/L (ref 48–121)
BUN/Creatinine Ratio: 11 (ref 9–23)
BUN: 11 mg/dL (ref 6–24)
Bilirubin Total: 0.6 mg/dL (ref 0.0–1.2)
CO2: 25 mmol/L (ref 20–29)
Calcium: 9.4 mg/dL (ref 8.7–10.2)
Chloride: 98 mmol/L (ref 96–106)
Creatinine, Ser: 0.97 mg/dL (ref 0.57–1.00)
GFR calc Af Amer: 83 mL/min/{1.73_m2} (ref >59)
GFR calc non Af Amer: 72 mL/min/{1.73_m2} (ref >59)
Globulin, Total: 2.3 g/dL (ref 1.5–4.5)
Glucose: 97 mg/dL (ref 65–99)
Potassium: 4.6 mmol/L (ref 3.5–5.2)
Sodium: 137 mmol/L (ref 134–144)
Total Protein: 7 g/dL (ref 6.0–8.5)

## 2019-11-18 LAB — LIPID PANEL
Chol/HDL Ratio: 2.4 ratio (ref 0.0–4.4)
Cholesterol, Total: 177 mg/dL (ref 100–199)
HDL: 75 mg/dL (ref >39)
LDL Chol Calc (NIH): 91 mg/dL (ref 0–99)
Triglycerides: 59 mg/dL (ref 0–149)
VLDL Cholesterol Cal: 11 mg/dL (ref 5–40)

## 2019-11-18 LAB — CBC
Hematocrit: 40.2 % (ref 34.0–46.6)
Hemoglobin: 13.1 g/dL (ref 11.1–15.9)
MCH: 31 pg (ref 26.6–33.0)
MCHC: 32.6 g/dL (ref 31.5–35.7)
MCV: 95 fL (ref 79–97)
Platelets: 226 10*3/uL (ref 150–450)
RBC: 4.23 x10E6/uL (ref 3.77–5.28)
RDW: 12.5 % (ref 11.7–15.4)
WBC: 5.1 10*3/uL (ref 3.4–10.8)

## 2019-11-18 LAB — TSH: TSH: 0.962 u[IU]/mL (ref 0.450–4.500)

## 2019-11-18 LAB — VITAMIN D 25 HYDROXY (VIT D DEFICIENCY, FRACTURES): Vit D, 25-Hydroxy: 40.6 ng/mL (ref 30.0–100.0)

## 2019-11-22 ENCOUNTER — Other Ambulatory Visit: Payer: Self-pay | Admitting: Obstetrics and Gynecology

## 2019-11-22 DIAGNOSIS — Z1231 Encounter for screening mammogram for malignant neoplasm of breast: Secondary | ICD-10-CM

## 2019-11-23 ENCOUNTER — Telehealth: Payer: Self-pay | Admitting: Genetic Counselor

## 2019-11-23 NOTE — Telephone Encounter (Signed)
Received a genetic counseling referral from Dr. Oscar La for fhx of ovarian and uterine cancer. Ms. Brogden has been scheduled to see Irving Burton on 10/5 at 11am. Pt aware to arrive 15 minutes early.

## 2019-12-14 ENCOUNTER — Ambulatory Visit: Payer: 59

## 2019-12-28 ENCOUNTER — Encounter: Payer: Self-pay | Admitting: Genetic Counselor

## 2019-12-28 ENCOUNTER — Inpatient Hospital Stay: Payer: 59 | Attending: Genetic Counselor | Admitting: Genetic Counselor

## 2019-12-28 ENCOUNTER — Inpatient Hospital Stay: Payer: 59

## 2019-12-28 ENCOUNTER — Other Ambulatory Visit: Payer: Self-pay

## 2019-12-28 DIAGNOSIS — Z8041 Family history of malignant neoplasm of ovary: Secondary | ICD-10-CM

## 2019-12-28 DIAGNOSIS — Z8049 Family history of malignant neoplasm of other genital organs: Secondary | ICD-10-CM | POA: Diagnosis not present

## 2019-12-28 DIAGNOSIS — Z800 Family history of malignant neoplasm of digestive organs: Secondary | ICD-10-CM | POA: Diagnosis not present

## 2019-12-28 DIAGNOSIS — Z803 Family history of malignant neoplasm of breast: Secondary | ICD-10-CM | POA: Diagnosis not present

## 2019-12-28 NOTE — Progress Notes (Signed)
REFERRING PROVIDER: Jertson, Jill Evelyn, MD [address] [place],  [place] [address]  PRIMARY PROVIDER:  Shaw, William, MD  PRIMARY REASON FOR VISIT:  1. Family history of ovarian cancer   2. Family history of uterine cancer   3. Family history of breast cancer   4. Family history of colon cancer   5. Family history of cervical cancer      HISTORY OF PRESENT ILLNESS:   Cynthia Dodson, a 44 y.o. female, was seen for a [place] cancer genetics consultation at the request of Dr. Jertson due to a family history of cancer.  Cynthia Dodson presents to clinic today to discuss the possibility of a hereditary predisposition to cancer, genetic testing, and to further clarify her future cancer risks, as well as potential cancer risks for family members.   Cynthia Dodson does not have a personal history of cancer.    RISK FACTORS:  Menarche was at age 17.  First live birth at age 28.  OCP use for approximately 5-6 years.  Ovaries intact: yes.  Hysterectomy: no.  Menopausal status: premenopausal.  HRT use: 0 years. Colonoscopy: yes; 2010, normal. Mammogram within the last year: no, scheduled 01/06/2020. Number of breast biopsies: 0. Up to date with pelvic exams: yes. Any excessive radiation exposure in the past: no   Past Medical History:  Diagnosis Date  . Cystitis, interstitial   . Family history of breast cancer   . Family history of cervical cancer   . Family history of colon cancer   . Family history of ovarian cancer   . Family history of uterine cancer   . Headache(784.0)   . Migraine with aura     Past Surgical History:  Procedure Laterality Date  . BREAST SURGERY    . POLYPECTOMY    . TONSILLECTOMY    . WISDOM TOOTH EXTRACTION      Social History   Socioeconomic History  . Marital status: Married    Spouse name: Not on file  . Number of children: Not on file  . Years of education: Not on file  . Highest education level: Not on file  Occupational  History  . Not on file  Tobacco Use  . Smoking status: Never Smoker  . Smokeless tobacco: Never Used  Vaping Use  . Vaping Use: Never used  Substance and Sexual Activity  . Alcohol use: Yes    Comment: OCCASIONAL  . Drug use: No  . Sexual activity: Yes    Birth control/protection: I.U.D.    Comment: Vasectomy, expired Mirena IUD  Other Topics Concern  . Not on file  Social History Narrative  . Not on file   Social Determinants of Health   Financial Resource Strain:   . Difficulty of Paying Living Expenses: Not on file  Food Insecurity:   . Worried About Running Out of Food in the Last Year: Not on file  . Ran Out of Food in the Last Year: Not on file  Transportation Needs:   . Lack of Transportation (Medical): Not on file  . Lack of Transportation (Non-Medical): Not on file  Physical Activity:   . Days of Exercise per Week: Not on file  . Minutes of Exercise per Session: Not on file  Stress:   . Feeling of Stress : Not on file  Social Connections:   . Frequency of Communication with Friends and Family: Not on file  . Frequency of Social Gatherings with Friends and Family: Not on file  .   Attends Religious Services: Not on file  . Active Member of Clubs or Organizations: Not on file  . Attends Archivist Meetings: Not on file  . Marital Status: Not on file     FAMILY HISTORY:  We obtained a detailed, 4-generation family history.  Significant diagnoses are listed below: Family History  Problem Relation Age of Onset  . Depression Mother   . Fibromyalgia Mother   . Thyroid disease Mother   . Hypertension Mother   . Ovarian cancer Mother 41       OVARIAN, negative genetic testing 2010 and 2019  . Hypertension Paternal Grandmother   . Thyroid disease Maternal Grandmother   . Uterine cancer Maternal Grandmother 64       UTERINE  . Cervical cancer Other        MGM's sister  . Colon cancer Other        dx unknown age; MGM's brother  . Breast cancer Other          dx 70s/80s; PGF's sister   Cynthia Dodson has one daughter (age 66) and two sons (age 79 and 72). She has one brother and two nieces. None of these family members have had cancer.  Cynthia Dodson mother is 17 and has a history of ovarian cancer diagnosed when she was 68. This ovarian cancer was treated with surgery and did not require chemo or radiation. She had negative genetic testing twice - first in 2010 and then again with panel testing around 2019 (results not available for review today). Cynthia Dodson has two maternal uncles and two maternal aunts. Her maternal grandmother died at the age of 58 from uterine cancer, diagnosed around the age of 57. Her maternal grandfather died at the age of 17 with renal failure. Cynthia Dodson maternal grandmother had 11 siblings - one sister had cervical cancer and one brother had colon cancer.  Cynthia Dodson father is 1 and has not had cancer. She has two paternal aunts. Her paternal grandparents are both 86. Her paternal grandfather has a sister who was diagnosed with breast cancer in her 11s or 60s.   Cynthia Dodson is aware of previous family history of genetic testing for hereditary cancer risks in her mother. Patient's ancestors are of unknown descent. There is no reported Ashkenazi Jewish ancestry. There is no known consanguinity.  GENETIC COUNSELING ASSESSMENT: Cynthia Dodson is a 44 y.o. female with a family history of ovarian cancer and negative genetic testing, uterine cancer, colon cancer, breast cancer, and cervical cancer, which is not highly suggestive of a hereditary cancer syndrome. We, therefore, discussed and recommended the following at today's visit.   DISCUSSION: We discussed that approximately 10-15% of ovarian cancer is hereditary, with most cases associated with the BRCA1 and BRCA2 genes. There are other genes that can be associated with hereditary ovarian cancer syndromes. These include BRIP1, RAD51C, RAD51D, and the Lynch syndrome genes. We  discussed that identifying a hereditary cancer syndrome is beneficial for several reasons, including knowing about other cancer risks, identifying potential screening and risk-reduction options that may be appropriate, and to understand if other family members could be at risk for cancer and allow them to undergo genetic testing.   We reviewed the characteristics, features and inheritance patterns of hereditary cancer syndromes. We also discussed genetic testing, including the appropriate family members to test. Because her mother has had negative genetic testing, further testing is likely not indicated for Cynthia Dodson. It will be important to review her mother's genetic  test results to determine whether additional genetic testing may be appropriate for this family. Cynthia Dodson will try to obtain a copy of her mother's genetic test results to share with [place], and we will discuss whether further testing is necessary at that time. In the meantime, we recommended Cynthia Dodson continue to follow the cancer screening guidelines given by her primary healthcare provider.  PLAN:  We did not recommend genetic testing today.  Cynthia Dodson will attempt to obtain a copy of her mother's genetic test results for review to determine whether further genetic testing may be appropriate for this family. In the meantime, we recommend Cynthia Dodson continue to follow the cancer screening guidelines given by her primary healthcare provider.  Cynthia Dodson questions were answered to her satisfaction today. Our contact information was provided should additional questions or concerns arise. Thank you for the referral and allowing [place] to share in the care of your patient.   Clint Guy, [place], [facility] Licensed, Certified Dispensing [email or web address] Phone: [phone]  The patient was seen for a total of 35 minutes in face-to-face genetic counseling.  This patient was discussed with Drs. Magrinat, Lindi Adie and/or Burr Medico  who agrees with the above.    _______________________________________________________________________ For Office Staff:  Number of people involved in session: 1 Was an Intern/ student involved with case: no

## 2020-01-06 ENCOUNTER — Ambulatory Visit: Payer: 59

## 2020-01-18 ENCOUNTER — Ambulatory Visit: Payer: 59

## 2020-01-21 ENCOUNTER — Telehealth: Payer: Self-pay | Admitting: Genetic Counselor

## 2020-01-21 ENCOUNTER — Other Ambulatory Visit: Payer: Self-pay

## 2020-01-21 ENCOUNTER — Ambulatory Visit (INDEPENDENT_AMBULATORY_CARE_PROVIDER_SITE_OTHER): Payer: 59

## 2020-01-21 VITALS — BP 116/68 | HR 74 | Resp 12 | Ht 62.0 in | Wt 123.0 lb

## 2020-01-21 DIAGNOSIS — R053 Chronic cough: Secondary | ICD-10-CM

## 2020-01-21 DIAGNOSIS — Z23 Encounter for immunization: Secondary | ICD-10-CM | POA: Diagnosis not present

## 2020-01-21 DIAGNOSIS — J383 Other diseases of vocal cords: Secondary | ICD-10-CM

## 2020-01-21 DIAGNOSIS — Z7185 Encounter for immunization safety counseling: Secondary | ICD-10-CM | POA: Diagnosis not present

## 2020-01-21 DIAGNOSIS — R0600 Dyspnea, unspecified: Secondary | ICD-10-CM

## 2020-01-21 DIAGNOSIS — J387 Other diseases of larynx: Secondary | ICD-10-CM

## 2020-01-21 NOTE — Telephone Encounter (Signed)
mychart message sent by pt which is posted below:  To: [facility]    From: Alexandr Oehler    Created: 01/21/2020 9:27 AM     *-*-*This message was handled on 01/21/2020 9:44 AM by Pearlie Oyster, Lorree Millar P*-*-*  Hi Dr. Marchelle Gearing! Hope you are well! At my visit with you over the summer, we discussed seeing ENT and you said there was someone specific at [facility] that you would like me to see. I never got a call from them, and wondering the best way to schedule that appointment. Thank you for your time and guidance!  With Lavanna, Rog [phone]     This pt is wife of Dr. Dierdre Forth. Referral for ENT was placed back in July 2021. [facility], can you please help [place] investigate this.

## 2020-01-21 NOTE — Telephone Encounter (Addendum)
TP put in order for ENT on 7/16.  Order was faxed to [facility] (which is [facility]) on 7/19.  I called to check on referral today.  Pt was scheduled on 8/16 with Dr Jenne Pane and pt cancelled due to wanting to see another provider.  She never called back to schedule.

## 2020-01-21 NOTE — Telephone Encounter (Signed)
New mychart message sent by pt:  To: [facility]    From: Joanann Mies    Created: 01/21/2020 3:05 PM     *-*-*This message has not been handled.*-*-*  That's right.I did cancel that appointment. Dr. 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 Marchelle Gearing back in August to confirm that Dr. Jenne Pane was indeed who he wanted [place] to see. And Dr. Marchelle Gearing told my husband to have me cancel the appt. With Dr. Jenne Pane as he wanted me to see this doctor (not sure his name) at [facility].  Sorry for the confusion. I guess I need to know who I need to call at [facility], or if a referral has been called In there for me. Thank you for your time and patience. Jill Abbygail [place], please advise on phone number for me to give to pt so she can call to get referral scheduled again.

## 2020-01-21 NOTE — Progress Notes (Signed)
Patient in today for her 2nd Gardasil injection.   Contraception: Vasectomy   LMP: 11/02/19  Last AEX: 11/17/19 with Dr Oscar La   Injection given in left deltoid. Patient tolerated shot well.   Advised patient, if not on birth control, to return for next injection with cycle.   Routed to provider for final review.  Encounter closed.

## 2020-01-21 NOTE — Telephone Encounter (Signed)
Referral has been placed for referral to Dr. Delford Field. Nothing further needed.

## 2020-01-21 NOTE — Telephone Encounter (Signed)
It sounds like he probably wants him to see Dr Barnie Alderman at [facility].  They refer to him quite a bit.  Do you want to put in a referral to him and we will get her scheduled there?

## 2020-01-21 NOTE — Telephone Encounter (Signed)
Spoke with Ms. Whitford to follow-up with whether she was able to obtain a copy of her mother's genetic test report. Her mother was able to find a copy of the report and Ms. Hutt will plan to send [place] a copy of the report via email. Once we receive the report, we will call her to discuss whether additional testing may be warranted.

## 2020-02-22 ENCOUNTER — Other Ambulatory Visit: Payer: Self-pay

## 2020-02-22 ENCOUNTER — Ambulatory Visit
Admission: RE | Admit: 2020-02-22 | Discharge: 2020-02-22 | Disposition: A | Discharge status: Home / Self Care | Payer: 59 | Source: Ambulatory Visit | Attending: Obstetrics and Gynecology | Admitting: Obstetrics and Gynecology

## 2020-02-22 DIAGNOSIS — Z1231 Encounter for screening mammogram for malignant neoplasm of breast: Secondary | ICD-10-CM

## 2020-04-24 ENCOUNTER — Encounter: Payer: Self-pay | Admitting: Nurse Practitioner

## 2020-04-24 ENCOUNTER — Other Ambulatory Visit: Payer: Self-pay

## 2020-04-24 ENCOUNTER — Ambulatory Visit: Payer: 59 | Admitting: Nurse Practitioner

## 2020-04-24 VITALS — BP 108/68 | HR 68 | Temp 99.0°F | Resp 16 | Wt 122.0 lb

## 2020-04-24 DIAGNOSIS — N3090 Cystitis, unspecified without hematuria: Secondary | ICD-10-CM | POA: Diagnosis not present

## 2020-04-24 DIAGNOSIS — R350 Frequency of micturition: Secondary | ICD-10-CM | POA: Diagnosis not present

## 2020-04-24 LAB — WET PREP FOR TRICH, YEAST, CLUE

## 2020-04-24 MED ORDER — SULFAMETHOXAZOLE-TRIMETHOPRIM 800-160 MG PO TABS: 1.0000 | ORAL_TABLET | Freq: Two times a day (BID) | ORAL | 0 refills | Status: DC

## 2020-04-24 NOTE — Patient Instructions (Signed)
Urinary Tract Infection, Adult A urinary tract infection (UTI) is an infection of any part of the urinary tract. The urinary tract includes:  The kidneys.  The ureters.  The bladder.  The urethra. These organs make, store, and get rid of pee (urine) in the body. What are the causes? This infection is caused by germs (bacteria) in your genital area. These germs grow and cause swelling (inflammation) of your urinary tract. What increases the risk? The following factors may make you more likely to develop this condition:  Using a small, thin tube (catheter) to drain pee.  Not being able to control when you pee or poop (incontinence).  Being female. If you are female, these things can increase the risk: ? Using these methods to prevent pregnancy:  A medicine that kills sperm (spermicide).  A device that blocks sperm (diaphragm). ? Having low levels of a female hormone (estrogen). ? Being pregnant. You are more likely to develop this condition if:  You have genes that add to your risk.  You are sexually active.  You take antibiotic medicines.  You have trouble peeing because of: ? A prostate that is bigger than normal, if you are female. ? A blockage in the part of your body that drains pee from the bladder. ? A kidney stone. ? A nerve condition that affects your bladder. ? Not getting enough to drink. ? Not peeing often enough.  You have other conditions, such as: ? Diabetes. ? A weak disease-fighting system (immune system). ? Sickle cell disease. ? Gout. ? Injury of the spine. What are the signs or symptoms? Symptoms of this condition include:  Needing to pee right away.  Peeing small amounts often.  Pain or burning when peeing.  Blood in the pee.  Pee that smells bad or not like normal.  Trouble peeing.  Pee that is cloudy.  Fluid coming from the vagina, if you are female.  Pain in the belly or lower back. Other symptoms include:  Vomiting.  Not  feeling hungry.  Feeling mixed up (confused). This may be the first symptom in older adults.  Being tired and grouchy (irritable).  A fever.  Watery poop (diarrhea). How is this treated?  Taking antibiotic medicine.  Taking other medicines.  Drinking enough water. In some cases, you may need to see a specialist. Follow these instructions at home: Medicines  Take over-the-counter and prescription medicines only as told by your doctor.  If you were prescribed an antibiotic medicine, take it as told by your doctor. Do not stop taking it even if you start to feel better. General instructions  Make sure you: ? Pee until your bladder is empty. ? Do not hold pee for a long time. ? Empty your bladder after sex. ? Wipe from front to back after peeing or pooping if you are a female. Use each tissue one time when you wipe.  Drink enough fluid to keep your pee pale yellow.  Keep all follow-up visits.   Contact a doctor if:  You do not get better after 1-2 days.  Your symptoms go away and then come back. Get help right away if:  You have very bad back pain.  You have very bad pain in your lower belly.  You have a fever.  You have chills.  You feeling like you will vomit or you vomit. Summary  A urinary tract infection (UTI) is an infection of any part of the urinary tract.  This condition is caused by   germs in your genital area.  There are many risk factors for a UTI.  Treatment includes antibiotic medicines.  Drink enough fluid to keep your pee pale yellow. This information is not intended to replace advice given to you by your health care provider. Make sure you discuss any questions you have with your health care provider. Document Revised: 10/22/2019 Document Reviewed: 10/22/2019 Elsevier Patient Education  2021 Elsevier Inc.  

## 2020-04-24 NOTE — Progress Notes (Signed)
GYNECOLOGY  VISIT  CC:   Pt reports she provided a urine specimen with Dr. Clelia Croft 2 weeks ago and he called in [place], took 7 days. Unknown if culture was done. Finished last dose Thursday (January 27), but still having pain, urgency, frequency, voiding small amounts, having to go again in short time.  Wants to make sure everything is okay. Family hx: mother with ovarian cancer at age 43 Pt is not concerned about infidelity, does not want STD testing. Agreeable to wet mount to assess for yeast/bv  HPI: 45 y.o. [id number] Married White or Caucasian female here for uti.     GYNECOLOGIC HISTORY: Patient's last menstrual period was 04/18/2020 (exact date). Contraception: husband vasectomy Menopausal hormone therapy: none  Patient Active Problem List   Diagnosis Date Noted  . Family history of ovarian cancer   . Family history of uterine cancer   . Family history of breast cancer   . Family history of colon cancer   . Family history of cervical cancer   . 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 Migraine with aura   . Cystitis, interstitial   . Hx of migraines     Past Medical History:  Diagnosis Date  . Cystitis, interstitial   . Family history of breast cancer   . Family history of cervical cancer   . Family history of colon cancer   . Family history of ovarian cancer   . Family history of uterine cancer   . Headache(784.0)   . Migraine with aura     Past Surgical History:  Procedure Laterality Date  . BREAST SURGERY    . POLYPECTOMY    . TONSILLECTOMY    . WISDOM TOOTH EXTRACTION      MEDS:   Current Outpatient Medications on File Prior to Visit  Medication Sig Dispense Refill  . Ascorbic Acid (VITAMIN C PO) Take by mouth.    . Multiple Vitamin (MULTIVITAMIN PO) Take by mouth.    . Multiple Vitamins-Minerals (ZINC PO) Take by mouth.    Marland Kitchen VITAMIN D PO Take by mouth.     No current facility-administered medications on file prior to visit.    ALLERGIES: Patient has  no known allergies.  Family History  Problem Relation Age of Onset  . Depression Mother   . Fibromyalgia Mother   . Thyroid disease Mother   . Hypertension Mother   . Ovarian cancer Mother 30       OVARIAN, negative genetic testing 2010 and 2019  . Hypertension Paternal Grandmother   . Thyroid disease Maternal Grandmother   . Uterine cancer Maternal Grandmother 64       UTERINE  . Cervical cancer Other        MGM's sister  . Colon cancer Other        dx unknown age; MGM's brother  . Breast cancer Other        dx 70s/80s; PGF's sister    Review of Systems  Constitutional: Negative.   HENT: Negative.   Eyes: Negative.   Respiratory: Negative.   Cardiovascular: Negative.   Gastrointestinal: Negative.   Endocrine: Negative.   Genitourinary: Positive for dysuria, frequency and urgency.       Pelvic pressure, unable to empty completely, pain with intercourse  Musculoskeletal: Negative.   Skin: Negative.   Allergic/Immunologic: Negative.   Neurological: Negative.   Hematological: Negative.   Psychiatric/Behavioral: Negative.     PHYSICAL EXAMINATION:    BP 108/68   Pulse 68   Temp  99 F (37.2 C) (Oral)   Resp 16   Wt 122 lb (55.3 kg)   LMP 04/18/2020 (Exact Date)   BMI 22.31 kg/m     General appearance: alert, cooperative, no acute distress Neg CVAT Lymph:  no inguinal LAD noted  Pelvic: External genitalia:  no lesions              Urethra:  normal appearing urethra with no masses, tenderness or lesions, relaxation noted              Bartholins and Skenes: normal                 Vagina: normal appearing vagina (mild pelvic relaxation)              Cervix: no cervical motion tenderness and no lesions              Bimanual Exam:  Uterus:  normal size, contour, position, consistency, mobility, non-tender              Adnexa: no mass, fullness, tenderness               Chaperone, Joy, CMA, was present for exam.  Assessment: Urinary frequency - Plan:  Urinalysis,Complete w/RFL Culture, WET PREP FOR TRICH, YEAST, CLUE  Cystitis - Plan: sulfamethoxazole-trimethoprim (BACTRIM DS) 800-160 MG tablet    Plan: Culture sent Reassured that wet mount is normal Reassured that ovaries feel normal

## 2020-04-26 LAB — URINE CULTURE
MICRO NUMBER:: 11475567
SPECIMEN QUALITY:: ADEQUATE

## 2020-04-26 LAB — URINALYSIS, COMPLETE W/RFL CULTURE
Bacteria, UA: NONE SEEN /HPF
Bilirubin Urine: NEGATIVE
Glucose, UA: NEGATIVE
Hyaline Cast: NONE SEEN /LPF
Ketones, ur: NEGATIVE
Leukocyte Esterase: NEGATIVE
Nitrites, Initial: NEGATIVE
Specific Gravity, Urine: 1.007 (ref 1.001–1.03)
pH: 6 (ref 5.0–8.0)

## 2020-04-26 LAB — CULTURE INDICATED

## 2020-05-23 ENCOUNTER — Other Ambulatory Visit: Payer: Self-pay

## 2020-05-23 ENCOUNTER — Ambulatory Visit (INDEPENDENT_AMBULATORY_CARE_PROVIDER_SITE_OTHER): Payer: 59

## 2020-05-23 VITALS — BP 110/66 | HR 69 | Ht 64.0 in | Wt 122.3 lb

## 2020-05-23 DIAGNOSIS — Z23 Encounter for immunization: Secondary | ICD-10-CM | POA: Diagnosis not present

## 2020-05-23 NOTE — Progress Notes (Signed)
Patient in today for 3rd Gardasil injection.   Contraception: Vasectomy LMP: 05/13/20 Last AEX: 11/17/19 with JJ  Injection given in Right Deltoid. Patient tolerated shot well.   Patient informed she is finished with series.  Advised patient, if not on birth control, to return for next injection with cycle.   Routed to provider for final review.  Encounter closed.

## 2020-11-17 NOTE — Progress Notes (Signed)
45 y.o. G65P3003 Married White or Caucasian Not Hispanic or Latino female here for annual exam.  No dyspareunia.  Period Cycle (Days): 28 Period Duration (Days): 5 Period Pattern: Regular Menstrual Flow: Light Menstrual Control: Tampon Menstrual Control Change Freq (Hours): 6 Dysmenorrhea: (!) Moderate Dysmenorrhea Symptoms: Cramping, Headache, Diarrhea, Nausea She has bad headaches 1-2 days before she starts. Getting migraines with aura.   H/O grade 1-2 cystocele and rectocele. She thinks it may be a little worse. Occasional vaginal heaviness if she is on her feet all day.  Mild GSI with jumping. She has tried pelvic floor PT.   Long term issues with constipation. BM every 2-3 days if she is careful. Can go 1-2 weeks without having a BM.   Patient's last menstrual period was 11/03/2020 (approximate).          Sexually active: Yes.    The current method of family planning is vasectomy.    Exercising: Yes.     Yoga  Smoker:  no  Health Maintenance: Pap: 9 /13/19 negative, negative hpv  History of abnormal Pap:  no MMG:  02/22/20 density c bi-rads 1 neg BMD:   none Colonoscopy: none TDaP:  11/17/19 Gardasil: complete   reports that she has never smoked. She has never used smokeless tobacco. She reports current alcohol use of about 1.0 - 2.0 standard drink per week. She reports that she does not use drugs. 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 Husband is a Publishing rights manager. Sons are 9 and 77, daughter is 36.   Past Medical History:  Diagnosis Date   Cystitis, interstitial    Family history of breast cancer    Family history of cervical cancer    Family history of colon cancer    Family history of ovarian cancer    Family history of uterine cancer    Headache(784.0)    Migraine with aura     Past Surgical History:  Procedure Laterality Date   BREAST SURGERY     POLYPECTOMY     TONSILLECTOMY     WISDOM TOOTH EXTRACTION      Current Outpatient Medications  Medication  Sig Dispense Refill   Ascorbic Acid (VITAMIN C PO) Take by mouth.     Multiple Vitamin (MULTIVITAMIN PO) Take by mouth.     Multiple Vitamins-Minerals (ZINC PO) Take by mouth.     VITAMIN D PO Take by mouth.     No current facility-administered medications for this visit.    Family History  Problem Relation Age of Onset   Depression Mother    Fibromyalgia Mother    Thyroid disease Mother    Hypertension Mother    Ovarian cancer Mother 8       OVARIAN, negative genetic testing 2010 and 2019   Hypertension Paternal Grandmother    Thyroid disease Maternal Grandmother    Uterine cancer Maternal Grandmother 29       UTERINE   Cervical cancer Other        MGM's sister   Colon cancer Other        dx unknown age; MGM's brother   Breast cancer Other        dx 70s/80s; PGF's sister  Mom had ovarian cancer in her 15's, she had a hysterectomy and BSO. Never had chemotherapy. Mom had negative genetic testing.  MGM with uterine cancer, also young. Died at 66.   Review of Systems  All other systems reviewed and are negative.  Exam:   BP  108/60   Pulse (!) 53   Ht 5\' 4"  (1.626 m)   Wt 125 lb (56.7 kg)   LMP 11/03/2020 (Approximate)   SpO2 98%   BMI 21.46 kg/m   Weight change: @WEIGHTCHANGE @ Height:   Height: 5\' 4"  (162.6 cm)  Ht Readings from Last 3 Encounters:  11/20/20 5\' 4"  (1.626 m)  05/23/20 5\' 4"  (1.626 m)  01/21/20 5\' 2"  (1.575 m)    General appearance: alert, cooperative and appears stated age Head: Normocephalic, without obvious abnormality, atraumatic Neck: no adenopathy, supple, symmetrical, trachea midline and thyroid normal to inspection and palpation Lungs: clear to auscultation bilaterally Cardiovascular: regular rate and rhythm Breasts: normal appearance, no masses or tenderness Abdomen: soft, non-tender; non distended,  no masses,  no organomegaly Extremities: extremities normal, atraumatic, no cyanosis or edema Skin: Skin color, texture, turgor normal. No  rashes or lesions Lymph nodes: Cervical, supraclavicular, and axillary nodes normal. No abnormal inguinal nodes palpated Neurologic: Grossly normal   Pelvic: External genitalia:  no lesions              Urethra:  normal appearing urethra with no masses, tenderness or lesions              Bartholins and Skenes: normal                 Vagina: normal appearing vagina with normal color and discharge, no lesions. Grade 1-2 cystocele and rectocele with valsalva              Cervix: no lesions               Bimanual Exam:  Uterus:  normal size, contour, position, consistency, mobility, non-tender and anteverted              Adnexa: no mass, fullness, tenderness               Rectovaginal: Confirms               Anus:  normal sphincter tone, no lesions  11/22/20 chaperoned for the exam.  1. Well woman exam Discussed breast self exam Discussed calcium and vit D intake Mammogram in   2. Family history of ovarian cancer Mom had negative genetic testing   3. Migraine with aura and without status migrainosus, not intractable She will f/u with her primary  4. Screening for cervical cancer - Cytology - PAP  5. Constipation, unspecified constipation type - TSH  6. Laboratory exam ordered as part of routine general medical examination - CBC - Comprehensive metabolic panel - Lipid panel  7. Other female genital prolapse Mild cystocele and rectocele Try to avoid heavy lifting and straining

## 2020-11-20 ENCOUNTER — Other Ambulatory Visit (HOSPITAL_COMMUNITY)
Admission: RE | Admit: 2020-11-20 | Discharge: 2020-11-20 | Disposition: A | Discharge status: Home / Self Care | Payer: Self-pay | Source: Ambulatory Visit | Attending: Obstetrics and Gynecology | Admitting: Obstetrics and Gynecology

## 2020-11-20 ENCOUNTER — Encounter: Payer: Self-pay | Admitting: Obstetrics and Gynecology

## 2020-11-20 ENCOUNTER — Other Ambulatory Visit: Payer: Self-pay

## 2020-11-20 ENCOUNTER — Ambulatory Visit (INDEPENDENT_AMBULATORY_CARE_PROVIDER_SITE_OTHER): Payer: BC Managed Care – PPO | Admitting: Obstetrics and Gynecology

## 2020-11-20 VITALS — BP 108/60 | HR 53 | Ht 64.0 in | Wt 125.0 lb

## 2020-11-20 DIAGNOSIS — Z124 Encounter for screening for malignant neoplasm of cervix: Secondary | ICD-10-CM

## 2020-11-20 DIAGNOSIS — N8189 Other female genital prolapse: Secondary | ICD-10-CM

## 2020-11-20 DIAGNOSIS — Z8041 Family history of malignant neoplasm of ovary: Secondary | ICD-10-CM | POA: Diagnosis not present

## 2020-11-20 DIAGNOSIS — Z01419 Encounter for gynecological examination (general) (routine) without abnormal findings: Secondary | ICD-10-CM | POA: Diagnosis not present

## 2020-11-20 DIAGNOSIS — G43109 Migraine with aura, not intractable, without status migrainosus: Secondary | ICD-10-CM | POA: Diagnosis not present

## 2020-11-20 DIAGNOSIS — K5900 Constipation, unspecified: Secondary | ICD-10-CM

## 2020-11-20 DIAGNOSIS — R102 Pelvic and perineal pain: Secondary | ICD-10-CM | POA: Insufficient documentation

## 2020-11-20 DIAGNOSIS — Z0000 Encounter for general adult medical examination without abnormal findings: Secondary | ICD-10-CM

## 2020-11-20 NOTE — Patient Instructions (Signed)

## 2020-11-21 LAB — CBC
HCT: 40.2 % (ref 35.0–45.0)
Hemoglobin: 12.9 g/dL (ref 11.7–15.5)
MCH: 30.4 pg (ref 27.0–33.0)
MCHC: 32.1 g/dL (ref 32.0–36.0)
MCV: 94.8 fL (ref 80.0–100.0)
MPV: 9.8 fL (ref 7.5–12.5)
Platelets: 240 10*3/uL (ref 140–400)
RBC: 4.24 10*6/uL (ref 3.80–5.10)
RDW: 12.3 % (ref 11.0–15.0)
WBC: 7.5 10*3/uL (ref 3.8–10.8)

## 2020-11-21 LAB — COMPREHENSIVE METABOLIC PANEL
AG Ratio: 1.9 (calc) (ref 1.0–2.5)
ALT: 18 U/L (ref 6–29)
AST: 17 U/L (ref 10–30)
Albumin: 4.5 g/dL (ref 3.6–5.1)
Alkaline phosphatase (APISO): 50 U/L (ref 31–125)
BUN: 15 mg/dL (ref 7–25)
CO2: 27 mmol/L (ref 20–32)
Calcium: 9.5 mg/dL (ref 8.6–10.2)
Chloride: 100 mmol/L (ref 98–110)
Creat: 0.86 mg/dL (ref 0.50–0.99)
Globulin: 2.4 g/dL (calc) (ref 1.9–3.7)
Glucose, Bld: 82 mg/dL (ref 65–99)
Potassium: 4.4 mmol/L (ref 3.5–5.3)
Sodium: 135 mmol/L (ref 135–146)
Total Bilirubin: 0.7 mg/dL (ref 0.2–1.2)
Total Protein: 6.9 g/dL (ref 6.1–8.1)

## 2020-11-21 LAB — CYTOLOGY - PAP
Comment: NEGATIVE
Diagnosis: NEGATIVE
High risk HPV: NEGATIVE

## 2020-11-21 LAB — LIPID PANEL
Cholesterol: 176 mg/dL (ref <200)
HDL: 66 mg/dL (ref >=50)
LDL Cholesterol (Calc): 94 mg/dL (calc)
Non-HDL Cholesterol (Calc): 110 mg/dL (calc) (ref <130)
Total CHOL/HDL Ratio: 2.7 (calc) (ref <5.0)
Triglycerides: 71 mg/dL (ref <150)

## 2020-11-21 LAB — TSH: TSH: 1.32 mIU/L

## 2020-12-07 DIAGNOSIS — D225 Melanocytic nevi of trunk: Secondary | ICD-10-CM | POA: Diagnosis not present

## 2020-12-07 DIAGNOSIS — D2261 Melanocytic nevi of right upper limb, including shoulder: Secondary | ICD-10-CM | POA: Diagnosis not present

## 2020-12-07 DIAGNOSIS — L821 Other seborrheic keratosis: Secondary | ICD-10-CM | POA: Diagnosis not present

## 2020-12-07 DIAGNOSIS — D2262 Melanocytic nevi of left upper limb, including shoulder: Secondary | ICD-10-CM | POA: Diagnosis not present

## 2021-01-08 ENCOUNTER — Other Ambulatory Visit: Payer: Self-pay | Admitting: Obstetrics and Gynecology

## 2021-01-08 DIAGNOSIS — Z1231 Encounter for screening mammogram for malignant neoplasm of breast: Secondary | ICD-10-CM

## 2021-02-13 DIAGNOSIS — Z0000 Encounter for general adult medical examination without abnormal findings: Secondary | ICD-10-CM | POA: Diagnosis not present

## 2021-02-13 DIAGNOSIS — R7989 Other specified abnormal findings of blood chemistry: Secondary | ICD-10-CM | POA: Diagnosis not present

## 2021-02-20 DIAGNOSIS — J383 Other diseases of vocal cords: Secondary | ICD-10-CM | POA: Diagnosis not present

## 2021-02-20 DIAGNOSIS — Z0000 Encounter for general adult medical examination without abnormal findings: Secondary | ICD-10-CM | POA: Diagnosis not present

## 2021-02-20 DIAGNOSIS — Z1339 Encounter for screening examination for other mental health and behavioral disorders: Secondary | ICD-10-CM | POA: Diagnosis not present

## 2021-02-20 DIAGNOSIS — Z1331 Encounter for screening for depression: Secondary | ICD-10-CM | POA: Diagnosis not present

## 2021-02-22 ENCOUNTER — Ambulatory Visit
Admission: RE | Admit: 2021-02-22 | Discharge: 2021-02-22 | Disposition: A | Discharge status: Home / Self Care | Payer: BC Managed Care – PPO | Source: Ambulatory Visit | Attending: Obstetrics and Gynecology | Admitting: Obstetrics and Gynecology

## 2021-02-22 ENCOUNTER — Other Ambulatory Visit: Payer: Self-pay

## 2021-02-22 DIAGNOSIS — Z1231 Encounter for screening mammogram for malignant neoplasm of breast: Secondary | ICD-10-CM

## 2021-08-22 ENCOUNTER — Encounter: Payer: Self-pay | Admitting: Obstetrics and Gynecology

## 2021-12-11 DIAGNOSIS — D2262 Melanocytic nevi of left upper limb, including shoulder: Secondary | ICD-10-CM | POA: Diagnosis not present

## 2021-12-11 DIAGNOSIS — D1801 Hemangioma of skin and subcutaneous tissue: Secondary | ICD-10-CM | POA: Diagnosis not present

## 2021-12-11 DIAGNOSIS — D2261 Melanocytic nevi of right upper limb, including shoulder: Secondary | ICD-10-CM | POA: Diagnosis not present

## 2021-12-11 DIAGNOSIS — D2239 Melanocytic nevi of other parts of face: Secondary | ICD-10-CM | POA: Diagnosis not present

## 2021-12-11 DIAGNOSIS — D225 Melanocytic nevi of trunk: Secondary | ICD-10-CM | POA: Diagnosis not present

## 2021-12-11 DIAGNOSIS — D2272 Melanocytic nevi of left lower limb, including hip: Secondary | ICD-10-CM | POA: Diagnosis not present

## 2021-12-19 IMAGING — MG DIGITAL SCREENING BREAST BILAT IMPLANT W/ TOMO W/ CAD
8 of 12 series · 8 of 28 positions shown · non-contrast
Comparison: Previous exam(s).

CLINICAL DATA: Screening.

EXAM:
DIGITAL SCREENING BILATERAL MAMMOGRAM WITH IMPLANTS, CAD AND
TOMOSYNTHESIS
TECHNIQUE: Bilateral screening digital craniocaudal and mediolateral oblique
mammograms were obtained. Bilateral screening digital breast
tomosynthesis was performed. The images were evaluated with
computer-aided detection. Standard and/or implant displaced views
were performed.

[L CC]
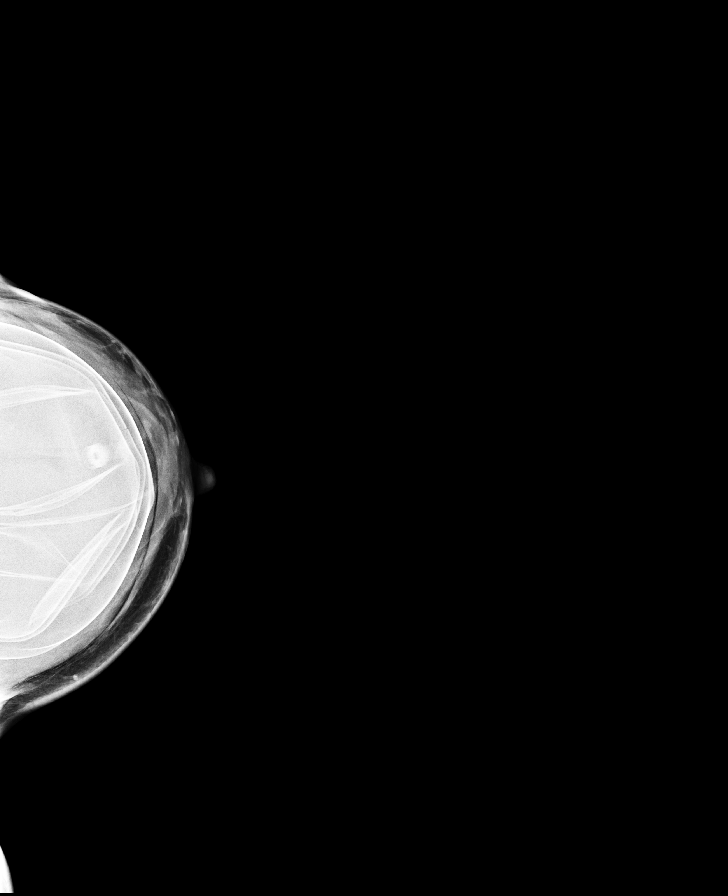

[R CC]
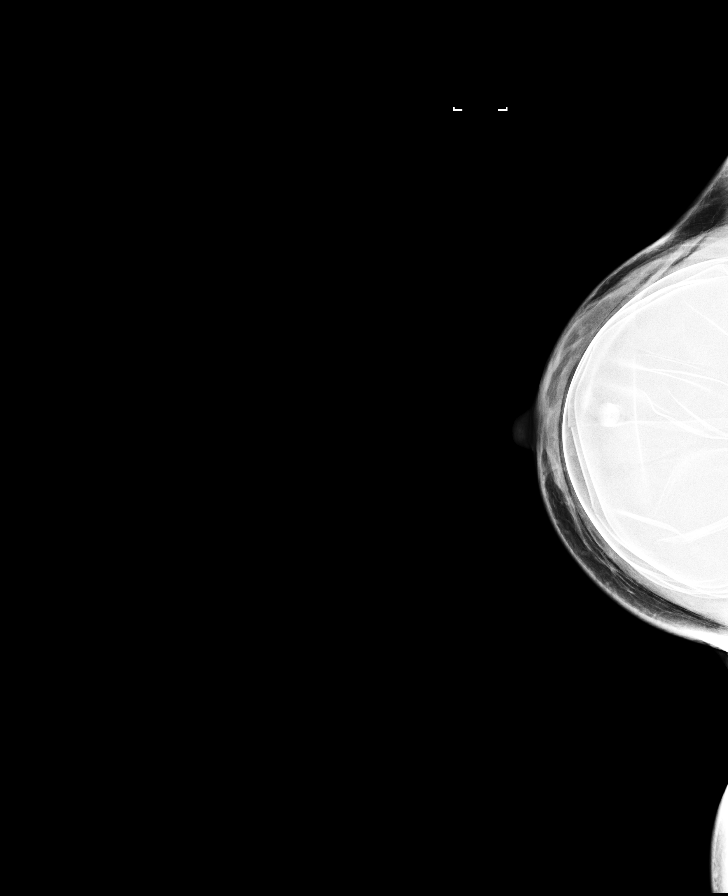

[L MLO]
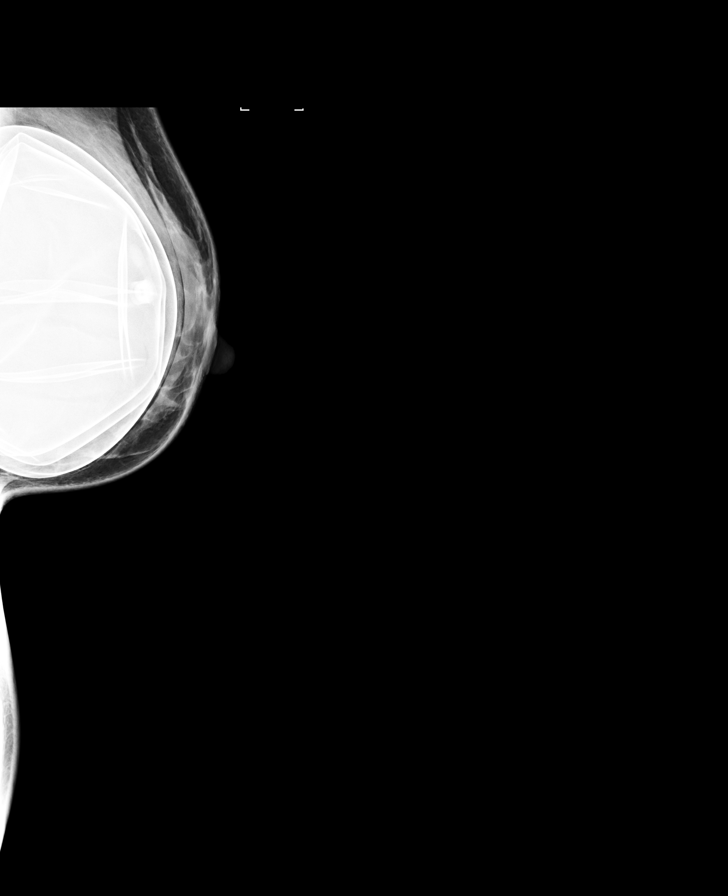

[R MLO]
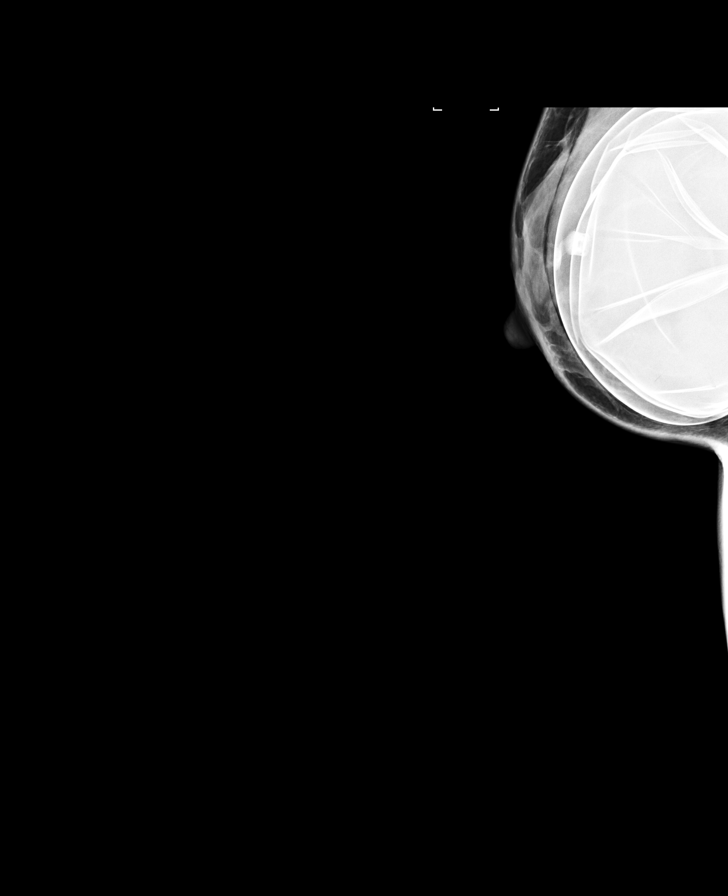

[R CC synth-2D]
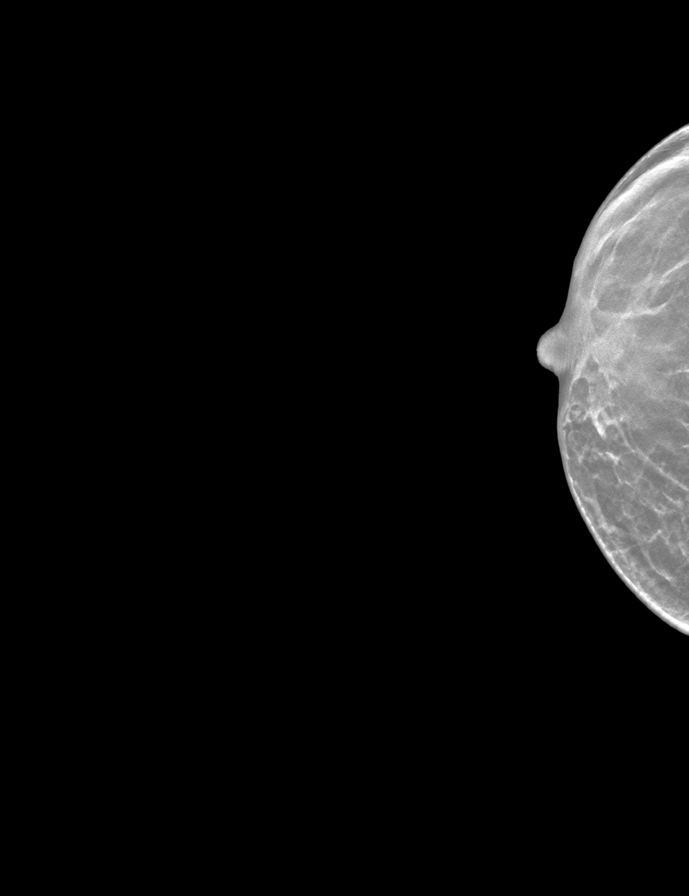

[R MLO synth-2D]
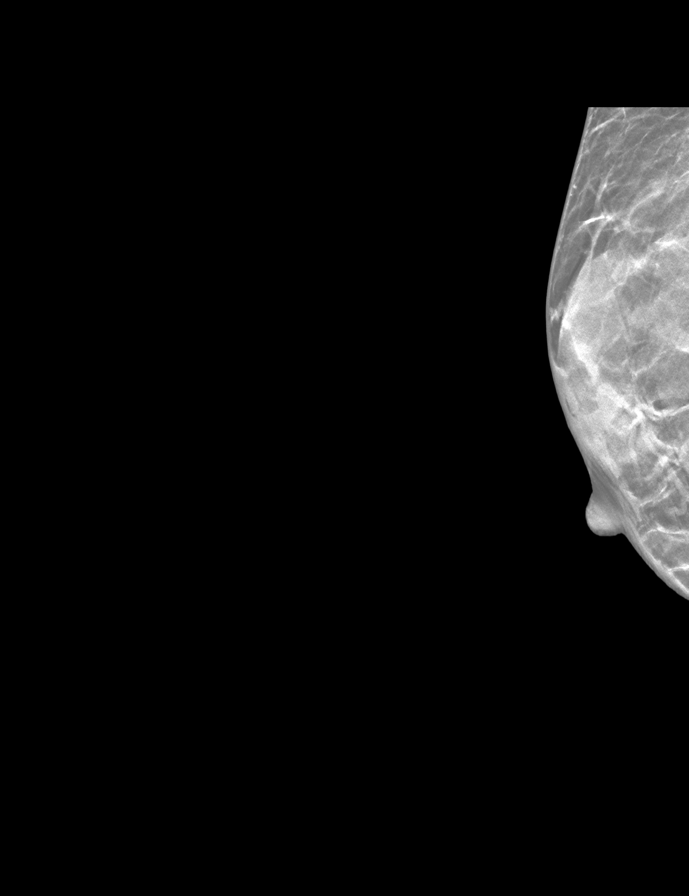

[L CC synth-2D]
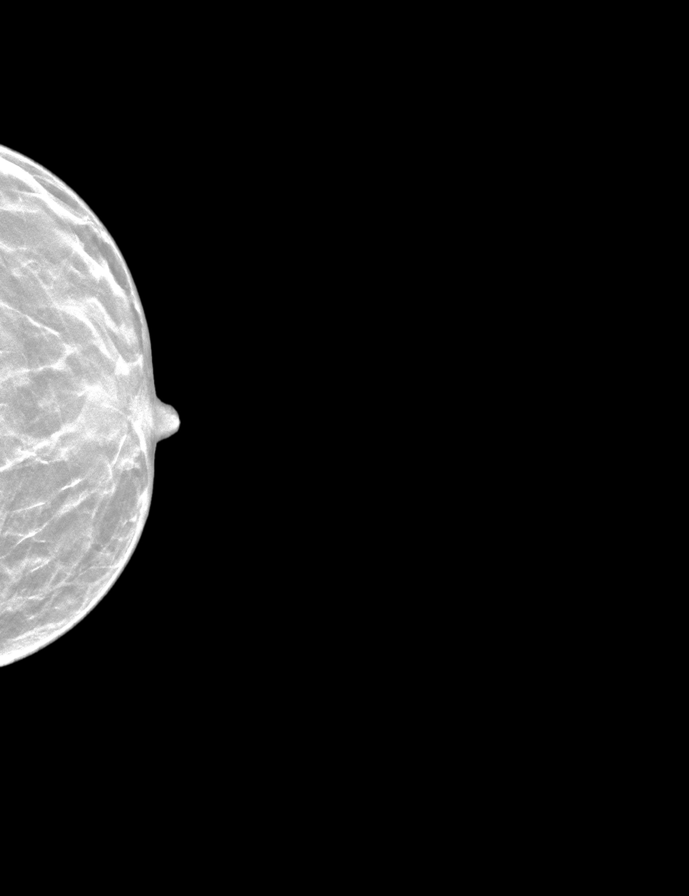

[L MLO synth-2D]
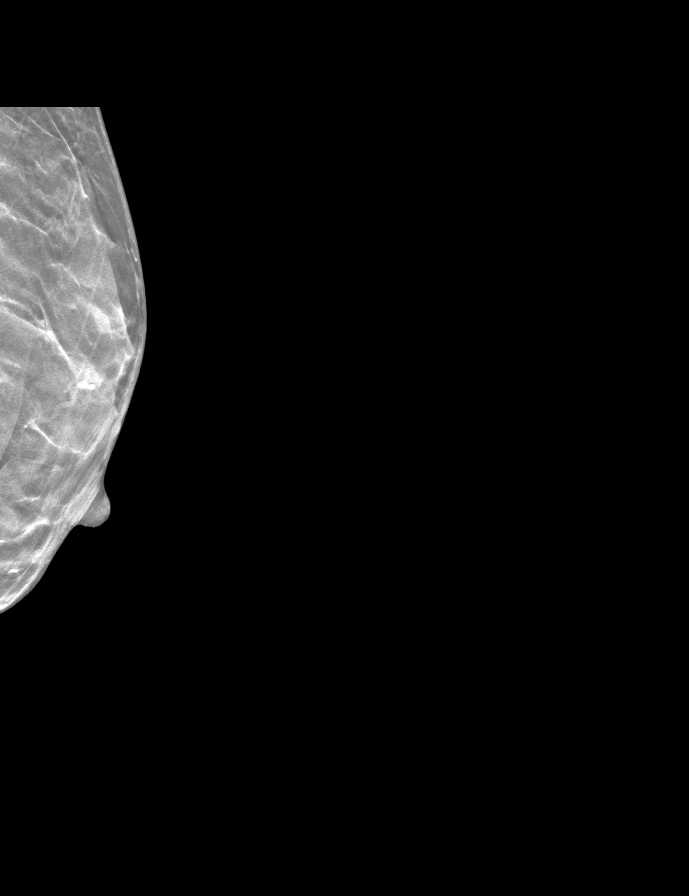

[8 of 28 positions shown; findings below may reference images not displayed]

ACR Breast Density Category c: The breast tissue is heterogeneously
dense, which may obscure small masses.
FINDINGS: The patient has retropectoral implants. There are no findings
suspicious for malignancy.
IMPRESSION: No mammographic evidence of malignancy. A result letter of this
screening mammogram will be mailed directly to the patient.

RECOMMENDATION:
Screening mammogram in one year. (Code:LT-E-7TH)

BI-RADS CATEGORY  1:  Negative.

## 2022-03-04 DIAGNOSIS — L509 Urticaria, unspecified: Secondary | ICD-10-CM | POA: Diagnosis not present

## 2022-03-04 DIAGNOSIS — R82998 Other abnormal findings in urine: Secondary | ICD-10-CM | POA: Diagnosis not present

## 2022-03-04 DIAGNOSIS — R058 Other specified cough: Secondary | ICD-10-CM | POA: Diagnosis not present

## 2022-03-04 DIAGNOSIS — F419 Anxiety disorder, unspecified: Secondary | ICD-10-CM | POA: Diagnosis not present

## 2022-03-04 DIAGNOSIS — R21 Rash and other nonspecific skin eruption: Secondary | ICD-10-CM | POA: Diagnosis not present

## 2022-03-04 DIAGNOSIS — J309 Allergic rhinitis, unspecified: Secondary | ICD-10-CM | POA: Diagnosis not present

## 2022-03-11 DIAGNOSIS — J383 Other diseases of vocal cords: Secondary | ICD-10-CM | POA: Diagnosis not present

## 2022-03-11 DIAGNOSIS — Z0000 Encounter for general adult medical examination without abnormal findings: Secondary | ICD-10-CM | POA: Diagnosis not present

## 2022-03-11 DIAGNOSIS — Z1339 Encounter for screening examination for other mental health and behavioral disorders: Secondary | ICD-10-CM | POA: Diagnosis not present

## 2022-03-11 DIAGNOSIS — Z1331 Encounter for screening for depression: Secondary | ICD-10-CM | POA: Diagnosis not present

## 2022-03-29 DIAGNOSIS — M50322 Other cervical disc degeneration at C5-C6 level: Secondary | ICD-10-CM | POA: Diagnosis not present

## 2022-04-11 NOTE — Progress Notes (Unsigned)
47 y.o. G2P3003 Married White or Caucasian Not Hispanic or Latino female here for annual exam.  She is having irritability, brain fog,fatigue. She feels irritable starting a couple of days prior to her cycle and for a few days into her cycle. She is having migraines with aura,  She has started on prn Neurtec for her migraines.  Period Cycle (Days):  (Between 21-28 days) Period Duration (Days): 4 Period Pattern: Regular Menstrual Flow: Moderate Menstrual Control: Tampon, Panty liner Menstrual Control Change Freq (Hours): 6 Dysmenorrhea: (!) Mild Dysmenorrhea Symptoms: Cramping, Headache  H/O grade 1-2 cystocele and rectocele.   Mild GSI with jumping. She has tried pelvic floor PT.   Long term issues with constipation. BM every 2-3 days if she is careful. Can go 1-2 weeks without having a BM.    Patient's last menstrual period was 03/29/2021.          Sexually active: Yes.    The current method of family planning is vasectomy.    Exercising: Yes.     Yoga teacher  Smoker:  no  Health Maintenance: Pap:  11/20/20 WNL Hr HPV Neg;  9 /13/19 negative, negative hpv  History of abnormal Pap:  no MMG:  02/22/21 denisty C Bi-rads 1 neg  BMD:   n/a Colonoscopy: n/a TDaP:  11/17/19 Gardasil: complete    reports that she has never smoked. She has never used smokeless tobacco. She reports current alcohol use of about 1.0 - 2.0 standard drink of alcohol per week. She reports that she does not use drugs. 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 Husband is a Merchant navy officer. [place] are 55 and 86, daughter is 41. Daughter will go to college in the fall.     Past Medical History:  Diagnosis Date   Cystitis, interstitial    Family history of breast cancer    Family history of cervical cancer    Family history of colon cancer    Family history of ovarian cancer    Family history of uterine cancer    Headache(784.0)    Migraine with aura     Past Surgical History:  Procedure Laterality Date    BREAST SURGERY     POLYPECTOMY     TONSILLECTOMY     WISDOM TOOTH EXTRACTION      Current Outpatient Medications  Medication Sig Dispense Refill   Ascorbic Acid (VITAMIN C PO) Take by mouth.     Multiple Vitamin (MULTIVITAMIN PO) Take by mouth.     Multiple Vitamins-Minerals (ZINC PO) Take by mouth.     Rimegepant Sulfate (NURTEC PO) Take by mouth.     VITAMIN D PO Take by mouth.     No current facility-administered medications for this visit.    Family History  Problem Relation Age of Onset   Depression Mother    Fibromyalgia Mother    Thyroid disease Mother    Hypertension Mother    Ovarian cancer Mother 84       OVARIAN, negative genetic testing 2010 and 2019   Hypertension Paternal Grandmother    Thyroid disease Maternal Grandmother    Uterine cancer Maternal Grandmother 100       UTERINE   Cervical cancer Other        MGM's sister   Colon cancer Other        dx unknown age; MGM's brother   Breast cancer Other        dx 18s/80s; PGF's sister  Mom had ovarian cancer in  her 5's, she had a hysterectomy and BSO. Never had chemotherapy. Mom had negative genetic testing.  MGM with uterine cancer, also young. Died at 64.   Review of Systems  Constitutional:  Positive for fatigue.  Genitourinary:  Positive for menstrual problem.  Neurological:  Positive for headaches.  Psychiatric/Behavioral:  Positive for agitation.   All other systems reviewed and are negative.   Exam:   BP 110/62   Pulse 60   Ht 5' 3.39" (1.61 m)   Wt 125 lb (56.7 kg)   LMP 03/29/2021   SpO2 100%   BMI 21.87 kg/m   Weight change: @WEIGHTCHANGE @ Height:   Height: 5' 3.39" (161 cm)  Ht Readings from Last 3 Encounters:  04/18/22 5' 3.39" (1.61 m)  11/20/20 5\' 4"  (1.626 m)  05/23/20 5\' 4"  (1.626 m)    General appearance: alert, cooperative and appears stated age Head: Normocephalic, without obvious abnormality, atraumatic Neck: no adenopathy, supple, symmetrical, trachea midline and thyroid  normal to inspection and palpation Lungs: clear to auscultation bilaterally Cardiovascular: regular rate and rhythm Breasts: normal appearance, no masses or tenderness Abdomen: soft, non-tender; non distended,  no masses,  no organomegaly Extremities: extremities normal, atraumatic, no cyanosis or edema Skin: Skin color, texture, turgor normal. No rashes or lesions Lymph nodes: Cervical, supraclavicular, and axillary nodes normal. No abnormal inguinal nodes palpated Neurologic: Grossly normal   Pelvic: External genitalia:  no lesions              Urethra:  normal appearing urethra with no masses, tenderness or lesions              Bartholins and Skenes: normal                 Vagina: normal appearing vagina with normal color and discharge, no lesions              Cervix: no lesions               Bimanual Exam:  Uterus:  normal size, contour, position, consistency, mobility, non-tender              Adnexa: no mass, fullness, tenderness               Rectovaginal: Confirms               Anus:  normal sphincter tone, no lesions  Gae Dry, CMA chaperoned for the exam.  1. Well woman exam Discussed breast self exam Discussed calcium and vit D intake Mammogram due, she will schedule Colon cancer screening next year  2. PMS (premenstrual syndrome) Not a candidate for OCP's. Discussed SSRI's, she has previously done well on Zoloft - sertraline (ZOLOFT) 50 MG tablet; 1/2 tablet a day for the first week, if tolerating then increase to one tablet a day.  Dispense: 90 tablet; Refill: 0 -F/U in 8-10 weeks  3. Migraine with aura and without status migrainosus, not intractable - Ambulatory referral to Neurology  4. Weight gain - Thyroid Panel With TSH  5. Fatigue, unspecified type - Thyroid Panel With TSH - CBC  6. Other female genital prolapse Mild and tolerable Avoid heavy lifting and straining Discussed option of pessary or surgery if needed

## 2022-04-18 ENCOUNTER — Encounter: Payer: Self-pay | Admitting: Obstetrics and Gynecology

## 2022-04-18 ENCOUNTER — Ambulatory Visit (INDEPENDENT_AMBULATORY_CARE_PROVIDER_SITE_OTHER): Payer: BC Managed Care – PPO | Admitting: Obstetrics and Gynecology

## 2022-04-18 VITALS — BP 110/62 | HR 60 | Ht 63.39 in | Wt 125.0 lb

## 2022-04-18 DIAGNOSIS — Z01419 Encounter for gynecological examination (general) (routine) without abnormal findings: Secondary | ICD-10-CM | POA: Diagnosis not present

## 2022-04-18 DIAGNOSIS — R635 Abnormal weight gain: Secondary | ICD-10-CM | POA: Diagnosis not present

## 2022-04-18 DIAGNOSIS — N8189 Other female genital prolapse: Secondary | ICD-10-CM | POA: Diagnosis not present

## 2022-04-18 DIAGNOSIS — R5383 Other fatigue: Secondary | ICD-10-CM

## 2022-04-18 DIAGNOSIS — N943 Premenstrual tension syndrome: Secondary | ICD-10-CM | POA: Diagnosis not present

## 2022-04-18 DIAGNOSIS — G43109 Migraine with aura, not intractable, without status migrainosus: Secondary | ICD-10-CM | POA: Diagnosis not present

## 2022-04-18 MED ORDER — SERTRALINE HCL 50 MG PO TABS: ORAL_TABLET | ORAL | 0 refills | Status: DC

## 2022-04-18 NOTE — Patient Instructions (Signed)
EXERCISE   We recommended that you start or continue a regular exercise program for good health. Physical activity is anything that gets your body moving, some is better than none. The CDC recommends 150 minutes per week of Moderate-Intensity Aerobic Activity and 2 or more days of Muscle Strengthening Activity.  Benefits of exercise are limitless: helps weight loss/weight maintenance, improves mood and energy, helps with depression and anxiety, improves sleep, tones and strengthens muscles, improves balance, improves bone density, protects from chronic conditions such as heart disease, high blood pressure and diabetes and so much more. To learn more visit: https://www.cdc.gov/physicalactivity/index.html  DIET: Good nutrition starts with a healthy diet of fruits, vegetables, whole grains, and lean protein sources. Drink plenty of water for hydration. Minimize empty calories, sodium, sweets. For more information about dietary recommendations visit: https://health.gov/our-work/nutrition-physical-activity/dietary-guidelines and https://www.myplate.gov/  ALCOHOL:  Women should limit their alcohol intake to no more than 7 drinks/beers/glasses of wine (combined, not each!) per week. Moderation of alcohol intake to this level decreases your risk of breast cancer and liver damage.  If you are concerned that you may have a problem, or your friends have told you they are concerned about your drinking, there are many resources to help. A well-known program that is free, effective, and available to all people all over the nation is Alcoholics Anonymous.  Check out this site to learn more: https://www.aa.org/   CALCIUM AND VITAMIN D:  Adequate intake of calcium and Vitamin D are recommended for bone health.  You should be getting between 1000-1200 mg of calcium and 800 units of Vitamin D daily between diet and supplements  PAP SMEARS:  Pap smears, to check for cervical cancer or precancers,  have traditionally been  done yearly, scientific advances have shown that most women can have pap smears less often.  However, every woman still should have a physical exam from her gynecologist every year. It will include a breast check, inspection of the vulva and vagina to check for abnormal growths or skin changes, a visual exam of the cervix, and then an exam to evaluate the size and shape of the uterus and ovaries. We will also provide age appropriate advice regarding health maintenance, like when you should have certain vaccines, screening for sexually transmitted diseases, bone density testing, colonoscopy, mammograms, etc.   MAMMOGRAMS:  All women over 40 years old should have a routine mammogram.   COLON CANCER SCREENING: Now recommend starting at age 45. At this time colonoscopy is not covered for routine screening until 50. There are take home tests that can be done between 45-49.   COLONOSCOPY:  Colonoscopy to screen for colon cancer is recommended for all women at age 50.  We know, you hate the idea of the prep.  We agree, BUT, having colon cancer and not knowing it is worse!!  Colon cancer so often starts as a polyp that can be seen and removed at colonscopy, which can quite literally save your life!  And if your first colonoscopy is normal and you have no family history of colon cancer, most women don't have to have it again for 10 years.  Once every ten years, you can do something that may end up saving your life, right?  We will be happy to help you get it scheduled when you are ready.  Be sure to check your insurance coverage so you understand how much it will cost.  It may be covered as a preventative service at no cost, but you should check   your particular policy.      Breast Self-Awareness Breast self-awareness means being familiar with how your breasts look and feel. It involves checking your breasts regularly and reporting any changes to your health care provider. Practicing breast self-awareness is  important. A change in your breasts can be a sign of a serious medical problem. Being familiar with how your breasts look and feel allows you to find any problems early, when treatment is more likely to be successful. All women should practice breast self-awareness, including women who have had breast implants. How to do a breast self-exam One way to learn what is normal for your breasts and whether your breasts are changing is to do a breast self-exam. To do a breast self-exam: Look for Changes  Remove all the clothing above your waist. Stand in front of a mirror in a room with good lighting. Put your hands on your hips. Push your hands firmly downward. Compare your breasts in the mirror. Look for differences between them (asymmetry), such as: Differences in shape. Differences in size. Puckers, dips, and bumps in one breast and not the other. Look at each breast for changes in your skin, such as: Redness. Scaly areas. Look for changes in your nipples, such as: Discharge. Bleeding. Dimpling. Redness. A change in position. Feel for Changes Carefully feel your breasts for lumps and changes. It is best to do this while lying on your back on the floor and again while sitting or standing in the shower or tub with soapy water on your skin. Feel each breast in the following way: Place the arm on the side of the breast you are examining above your head. Feel your breast with the other hand. Start in the nipple area and make  inch (2 cm) overlapping circles to feel your breast. Use the pads of your three middle fingers to do this. Apply light pressure, then medium pressure, then firm pressure. The light pressure will allow you to feel the tissue closest to the skin. The medium pressure will allow you to feel the tissue that is a little deeper. The firm pressure will allow you to feel the tissue close to the ribs. Continue the overlapping circles, moving downward over the breast until you feel your  ribs below your breast. Move one finger-width toward the center of the body. Continue to use the  inch (2 cm) overlapping circles to feel your breast as you move slowly up toward your collarbone. Continue the up and down exam using all three pressures until you reach your armpit.  Write Down What You Find  Write down what is normal for each breast and any changes that you find. Keep a written record with breast changes or normal findings for each breast. By writing this information down, you do not need to depend only on memory for size, tenderness, or location. Write down where you are in your menstrual cycle, if you are still menstruating. If you are having trouble noticing differences in your breasts, do not get discouraged. With time you will become more familiar with the variations in your breasts and more comfortable with the exam. How often should I examine my breasts? Examine your breasts every month. If you are breastfeeding, the best time to examine your breasts is after a feeding or after using a breast pump. If you menstruate, the best time to examine your breasts is 5-7 days after your period is over. During your period, your breasts are lumpier, and it may be more   difficult to notice changes. When should I see my health care provider? See your health care provider if you notice: A change in shape or size of your breasts or nipples. A change in the skin of your breast or nipples, such as a reddened or scaly area. Unusual discharge from your nipples. A lump or thick area that was not there before. Pain in your breasts. Anything that concerns you. Pelvic Organ Prolapse Pelvic organ prolapse is a condition in women that involves the stretching, bulging, or dropping of pelvic organs into an abnormal position, past the opening of the vagina. It happens when the muscles and tissues that surround and support pelvic structures become weak or stretched. Pelvic organ prolapse can involve  the: Vagina (vaginal prolapse). Uterus (uterine prolapse). Bladder (cystocele). Rectum (rectocele). Intestines (enterocele). When organs other than the vagina are involved, they often bulge into the vagina or protrude from the vagina, depending on how severe the prolapse is. What are the causes? This condition may be caused by: Pregnancy, labor, and childbirth. Past pelvic surgery. Lower levels of the hormone estrogen due to menopause. Consistently lifting more than 50 lb (23 kg). Obesity. Long-term difficulty passing stool (chronic constipation). Long-term, or chronic, cough. Fluid buildup in the abdomen due to certain conditions. What are the signs or symptoms? Symptoms of this condition include: Leaking a little urine (loss of bladder control) when you cough, sneeze, strain, and exercise (stress incontinence). This may be worse immediately after childbirth. It may gradually improve over time. Feeling pressure in your pelvis or vagina. This pressure may increase when you cough or when you are passing stool. A bulge that protrudes from the opening of your vagina. Difficulty passing urine or stool. Pain in your lower back. Pain or discomfort during sex, or decreased interest in sex. Repeated bladder infections (urinary tract infections). Difficulty inserting a tampon. In some people, this condition causes no symptoms. How is this diagnosed? This condition may be diagnosed based on a vaginal and rectal exam. During the exam, you may be asked to cough and strain while you are lying down, sitting, and standing up. Your health care provider will determine if other tests are required, such as bladder function tests. How is this treated? Treatment for this condition may depend on your symptoms. Treatment may include: Lifestyle changes, such as drinking plenty of fluids and eating foods that are high in fiber. Emptying your bladder at scheduled times (bladder training therapy). This can  help reduce or avoid urinary incontinence. Estrogen. This may help mild prolapse by increasing the strength and tone of pelvic floor muscles. Kegel exercises. These may help mild cases of prolapse by strengthening and tightening the muscles of the pelvic floor. A soft, flexible device that helps support the vaginal walls and keep pelvic organs in place (pessary). This is inserted into your vagina by your health care provider. Surgery. This is often the only form of treatment for severe prolapse. Follow these instructions at home: Eating and drinking Avoid drinking beverages that contain caffeine or alcohol. Increase your intake of high-fiber foods to decrease constipation and straining during bowel movements. Activity Lose weight if recommended by your health care provider. Avoid heavy lifting and straining with exercise and work. Do not hold your breath when you perform mild to moderate lifting and exercise activities. Limit your activities as directed by your health care provider. Do Kegel exercises as directed by your health care provider. To do this: Squeeze your pelvic floor muscles tight. You should feel  a tight lift in your rectal area and a tightness in your vaginal area. Keep your stomach, buttocks, and legs relaxed. Hold the muscles tight for up to 10 seconds. Then relax your muscles. Repeat this exercise 50 times a day, or as much as told by your health care provider. Continue to do this exercise for at least 4-6 weeks, or for as long as told by your health care provider. General instructions Take over-the-counter and prescription medicines only as told by your health care provider. Wear a sanitary pad or adult diapers if you have urinary incontinence. If you have a pessary, take care of it as told by your health care provider. Keep all follow-up visits. This is important. Contact a health care provider if you: Have symptoms that interfere with your daily activities or sex  life. Need medicine to help with the discomfort. Notice bleeding from your vagina that is not related to your menstrual period. Have a fever. Have pain or bleeding when you urinate. Have bleeding when you pass stool. Pass urine when you have sex. Have chronic constipation. Have a pessary that falls out. Have a foul-smelling vaginal discharge. Have an unusual, low pain in your abdomen. Get help right away if you: Cannot pass urine. Summary Pelvic organ prolapse is the stretching, bulging, or dropping of pelvic organs into an abnormal position. It happens when the muscles and tissues that surround and support pelvic structures become weak or stretched. When organs other than the vagina are involved, they often bulge into the vagina or protrude from it, depending on how severe the prolapse is. In most cases, this condition needs to be treated only if it produces symptoms. Treatment may include lifestyle changes, estrogen, Kegel exercises, pessary insertion, or surgery. Avoid heavy lifting and straining with exercise and work. Do not hold your breath when you perform mild to moderate lifting and exercise activities. Limit your activities as directed by your health care provider. This information is not intended to replace advice given to you by your health care provider. Make sure you discuss any questions you have with your health care provider. Document Revised: 09/06/2019 Document Reviewed: 09/06/2019 Elsevier Patient Education  [place]. Premenstrual Syndrome Premenstrual syndrome (PMS) is a group of physical, emotional, and behavioral symptoms that affect women as part of their menstrual cycle. PMS occurs 1-2 weeks before the start of a woman's menstrual period and goes away a few days after menstrual bleeding begins. PMS can range from mild to severe. What are the causes? The exact cause of this condition is not known, but it seems to be related to hormone changes that happen  before menstruation. What are the signs or symptoms? Symptoms of this condition often happen every month. They go away after your period starts. Physical symptoms of this condition include: Bloating. Breast pain or tenderness. Headaches. Extreme fatigue. Backaches. Swelling of the hands and feet. Weight gain. Hot flashes. Emotional symptoms of this condition include: Mood swings. Depression. Angry or hostile outbursts. Irritability. Anxiety. Crying spells. Behavioral symptoms include: Food cravings or appetite changes. Changes in sexual desire. Confusion. Social withdrawal. Poor concentration. How is this diagnosed? This condition may be diagnosed based on a history of your symptoms. This condition is generally diagnosed if symptoms of PMS: Are present in the 5 days before your period starts. End within 4 days after your period starts. Happen at least 3 months in a row. Interfere with some of your normal activities. Other conditions that can cause some of these  symptoms must be ruled out before PMS can be diagnosed. These include depression, anxiety, anemia, and thyroid problems. How is this treated? This condition may be treated by doing the following: Maintaining a healthy lifestyle. This includes eating a well-balanced diet and exercising regularly. Taking over-the-counter medicines that can help relieve symptoms, such as cramps, aches, pain, headaches, and breast tenderness. Follow these instructions at home: Eating and drinking  Eat a well-balanced diet. Avoid caffeine and alcohol. Limit the amount of salt and salty foods you eat. This will help reduce bloating. Drink enough fluid to keep your urine pale yellow. Take a multivitamin if told to do so by your health care provider. Lifestyle  Do not use any products that contain nicotine or tobacco. These products include cigarettes, chewing tobacco, and vaping devices, such as e-cigarettes. If you need help quitting,  ask your health care provider. Exercise regularly as suggested by your health care provider. Get enough sleep. For most adults, this is 7-8 hours of sleep each night. Practice relaxation techniques, such as yoga, tai chi, or meditation. Find healthy ways to manage stress. General instructions  For 2-3 months, write down your symptoms, whether they are mild to severe, and how long they last. This will help your health care provider choose the best treatment for you. Take over-the-counter and prescription medicines only as told by your health care provider. If you are using birth control pills (oral contraceptives), use them as told by your health care provider. Contact a health care provider if: Your symptoms get worse. You develop new symptoms. You have trouble doing your daily activities. Summary Premenstrual syndrome (PMS) is a group of physical, emotional, and behavioral symptoms that affect women as part of their menstrual cycle. PMS starts 1-2 weeks before the start of a woman's period and goes away a few days after the period starts. PMS is treated by maintaining a healthy lifestyle and taking medicines to relieve the symptoms. This information is not intended to replace advice given to you by your health care provider. Make sure you discuss any questions you have with your health care provider. Document Revised: 10/29/2019 Document Reviewed: 10/29/2019 Elsevier Patient Education  [place].

## 2022-04-19 LAB — CBC
HCT: 37.2 % (ref 35.0–45.0)
Hemoglobin: 12.4 g/dL (ref 11.7–15.5)
MCH: 31.2 pg (ref 27.0–33.0)
MCHC: 33.3 g/dL (ref 32.0–36.0)
MCV: 93.5 fL (ref 80.0–100.0)
MPV: 9.7 fL (ref 7.5–12.5)
Platelets: 256 10*3/uL (ref 140–400)
RBC: 3.98 10*6/uL (ref 3.80–5.10)
RDW: 12.5 % (ref 11.0–15.0)
WBC: 7.5 10*3/uL (ref 3.8–10.8)

## 2022-04-19 LAB — THYROID PANEL WITH TSH
Free Thyroxine Index: 1.9 (ref 1.4–3.8)
T3 Uptake: 33 % (ref 22–35)
T4, Total: 5.9 ug/dL (ref 5.1–11.9)
TSH: 1.2 mIU/L

## 2022-06-03 ENCOUNTER — Other Ambulatory Visit: Payer: Self-pay | Admitting: Obstetrics and Gynecology

## 2022-06-03 DIAGNOSIS — Z1231 Encounter for screening mammogram for malignant neoplasm of breast: Secondary | ICD-10-CM

## 2022-07-15 ENCOUNTER — Other Ambulatory Visit: Payer: Self-pay | Admitting: Obstetrics and Gynecology

## 2022-07-15 DIAGNOSIS — N943 Premenstrual tension syndrome: Secondary | ICD-10-CM

## 2022-07-18 ENCOUNTER — Ambulatory Visit
Admission: RE | Admit: 2022-07-18 | Discharge: 2022-07-18 | Disposition: A | Discharge status: Home / Self Care | Payer: BC Managed Care – PPO | Source: Ambulatory Visit | Attending: Obstetrics and Gynecology | Admitting: Obstetrics and Gynecology

## 2022-07-18 ENCOUNTER — Other Ambulatory Visit: Payer: Self-pay | Admitting: Obstetrics and Gynecology

## 2022-07-18 DIAGNOSIS — Z1231 Encounter for screening mammogram for malignant neoplasm of breast: Secondary | ICD-10-CM

## 2022-08-02 ENCOUNTER — Other Ambulatory Visit: Payer: Self-pay | Admitting: Obstetrics and Gynecology

## 2022-08-02 DIAGNOSIS — N943 Premenstrual tension syndrome: Secondary | ICD-10-CM

## 2022-08-05 ENCOUNTER — Other Ambulatory Visit: Payer: Self-pay | Admitting: Obstetrics and Gynecology

## 2022-08-05 DIAGNOSIS — N943 Premenstrual tension syndrome: Secondary | ICD-10-CM

## 2022-08-05 NOTE — Telephone Encounter (Signed)
Med refill request: Zoloft 50 mg tab Last AEX: 04/18/22 JJ Next AEX:  Last MMG (if hormonal med) Refill authorized: Please Advise?   8-10 wk f/u recommended at last AEX  MyChart message to patient

## 2022-08-06 MED ORDER — SERTRALINE HCL 50 MG PO TABS: ORAL_TABLET | ORAL | 0 refills | Status: DC

## 2022-08-06 NOTE — Telephone Encounter (Signed)
OV scheduled for 08/09/22 at 2:45pm  Routing to Dr. Oscar La

## 2022-08-06 NOTE — Telephone Encounter (Signed)
See open refill request dated 08/02/22.   Encounter closed.

## 2022-08-09 ENCOUNTER — Encounter: Payer: Self-pay | Admitting: Obstetrics and Gynecology

## 2022-08-09 ENCOUNTER — Ambulatory Visit: Payer: BC Managed Care – PPO | Admitting: Obstetrics and Gynecology

## 2022-08-09 VITALS — BP 114/72 | HR 75 | Resp 16

## 2022-08-09 DIAGNOSIS — N943 Premenstrual tension syndrome: Secondary | ICD-10-CM

## 2022-08-09 NOTE — Progress Notes (Signed)
GYNECOLOGY  VISIT   HPI: 47 y.o.   Married White or Caucasian Not Hispanic or Latino  female   [phone] with Patient's last menstrual period was 07/22/2022 (exact date).   here for f/u of PMS. She was started on Zoloft at the end of 1/24. Is unsure she wants to continue medication. She doesn't think it helped a lot, may have helped a little. She ran out of Zoloft a few weeks ago, doesn't feel different off it. It killed her libido.  She is not a candidate for OCP's  GYNECOLOGIC HISTORY: Patient's last menstrual period was 07/22/2022 (exact date). 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     Multiple Vitamin (MULTIVITAMIN PO) Take by mouth.     Multiple Vitamins-Minerals (ZINC PO) Take by mouth.     OVER THE COUNTER MEDICATION Take 3 capsules by mouth daily. [place]      VITAMIN D PO Take by mouth.     Rimegepant Sulfate (NURTEC PO) Take by mouth. (Patient not taking: Reported on 08/09/2022)     sertraline (ZOLOFT) 50 MG tablet one tablet po a day. (Patient not taking: Reported on 08/09/2022) 30 tablet 0   No current facility-administered medications for this visit.     ALLERGIES: Patient has no known allergies.  Family History  Problem Relation Age of Onset   Depression Mother    Fibromyalgia Mother    Thyroid disease Mother    Hypertension Mother    Ovarian cancer Mother 3       OVARIAN, negative genetic testing 2010 and 2019   Hypertension Paternal Grandmother    Thyroid disease Maternal Grandmother    Uterine cancer Maternal Grandmother 35       UTERINE   Cervical cancer Other        MGM's sister   Colon cancer Other  dx unknown age; MGM's brother   Breast cancer Other        dx 46s/80s; PGF's sister    Social History   Socioeconomic History   Marital status: Married    Spouse name: Not on file   Number of children: Not on file   Years of education: Not on file   Highest education level: Not on file  Occupational History   Not on file  Tobacco Use   Smoking status: Never   Smokeless tobacco: Never  Vaping Use   Vaping Use: Never used  Substance and Sexual Activity   Alcohol use: Yes    Alcohol/week: 1.0 - 2.0 standard drink of alcohol    Types: 1 - 2 Standard drinks or equivalent per week    Comment: OCCASIONAL   Drug use: No   Sexual activity: Yes    Birth control/protection: Other-see comments    Comment: Vasectomy  Other Topics Concern   Not on file  Social History Narrative   Not on file   Social Determinants of Health   Financial Resource Strain: Not on file  Food Insecurity: Not on file  Transportation Needs: Not on file  Physical Activity: Not on file  Stress: Not on file  Social Connections: Not on file  Intimate Partner Violence: Not on file    Review of Systems  All other systems reviewed and  are negative.   PHYSICAL EXAMINATION:    BP 114/72 (BP Location: Right Arm, Patient Position: Sitting)   Pulse 75   Resp 16   LMP 07/22/2022 (Exact Date)   SpO2 99%     General appearance: alert, cooperative and appears stated age  39. PMS (premenstrual syndrome) She tried Zoloft, didn't feel a big change in her symptoms and had trouble with libido. -She stopped the Zoloft 2 weeks ago -She is taking a supplement.  -We discussed eating healthy, exercise and good sleep habits -She will reach out if she wants to retry medication, would try Celexa 10 mg (consider luteal phase dosing vs continuous dosing)

## 2022-08-09 NOTE — Patient Instructions (Signed)
  Premenstrual Syndrome Premenstrual syndrome (PMS) is a group of physical, emotional, and behavioral symptoms that affect women as part of their menstrual cycle. PMS occurs 1-2 weeks before the start of a woman's menstrual period and goes away a few days after menstrual bleeding begins. PMS can range from mild to severe. What are the causes? The exact cause of this condition is not known, but it seems to be related to hormone changes that happen before menstruation. What are the signs or symptoms? Symptoms of this condition often happen every month. They go away after your period starts. Physical symptoms of this condition include: Bloating. Breast pain or tenderness. Headaches. Extreme fatigue. Backaches. Swelling of the hands and feet. Weight gain. Hot flashes. Emotional symptoms of this condition include: Mood swings. Depression. Angry or hostile outbursts. Irritability. Anxiety. Crying spells. Behavioral symptoms include: Food cravings or appetite changes. Changes in sexual desire. Confusion. Social withdrawal. Poor concentration. How is this diagnosed? This condition may be diagnosed based on a history of your symptoms. This condition is generally diagnosed if symptoms of PMS: Are present in the 5 days before your period starts. End within 4 days after your period starts. Happen at least 3 months in a row. Interfere with some of your normal activities. Other conditions that can cause some of these symptoms must be ruled out before PMS can be diagnosed. These include depression, anxiety, anemia, and thyroid problems. How is this treated? This condition may be treated by doing the following: Maintaining a healthy lifestyle. This includes eating a well-balanced diet and exercising regularly. Taking over-the-counter medicines that can help relieve symptoms, such as cramps, aches, pain, headaches, and breast tenderness. Follow these instructions at home: Eating and  drinking  Eat a well-balanced diet. Avoid caffeine and alcohol. Limit the amount of salt and salty foods you eat. This will help reduce bloating. Drink enough fluid to keep your urine pale yellow. Take a multivitamin if told to do so by your health care provider. Lifestyle  Do not use any products that contain nicotine or tobacco. These products include cigarettes, chewing tobacco, and vaping devices, such as e-cigarettes. If you need help quitting, ask your health care provider. Exercise regularly as suggested by your health care provider. Get enough sleep. For most adults, this is 7-8 hours of sleep each night. Practice relaxation techniques, such as yoga, tai chi, or meditation. Find healthy ways to manage stress. General instructions  For 2-3 months, write down your symptoms, whether they are mild to severe, and how long they last. This will help your health care provider choose the best treatment for you. Take over-the-counter and prescription medicines only as told by your health care provider. If you are using birth control pills (oral contraceptives), use them as told by your health care provider. Contact a health care provider if: Your symptoms get worse. You develop new symptoms. You have trouble doing your daily activities. Summary Premenstrual syndrome (PMS) is a group of physical, emotional, and behavioral symptoms that affect women as part of their menstrual cycle. PMS starts 1-2 weeks before the start of a woman's period and goes away a few days after the period starts. PMS is treated by maintaining a healthy lifestyle and taking medicines to relieve the symptoms. This information is not intended to replace advice given to you by your health care provider. Make sure you discuss any questions you have with your health care provider. Document Revised: 10/29/2019 Document Reviewed: 10/29/2019 Elsevier Patient Education  2023 [employer]   Inc[employer]  

## 2022-12-19 DIAGNOSIS — Z6821 Body mass index (BMI) 21.0-21.9, adult: Secondary | ICD-10-CM | POA: Diagnosis not present

## 2022-12-19 DIAGNOSIS — Z124 Encounter for screening for malignant neoplasm of cervix: Secondary | ICD-10-CM | POA: Diagnosis not present

## 2022-12-19 DIAGNOSIS — Z1321 Encounter for screening for nutritional disorder: Secondary | ICD-10-CM | POA: Diagnosis not present

## 2022-12-19 DIAGNOSIS — Z01419 Encounter for gynecological examination (general) (routine) without abnormal findings: Secondary | ICD-10-CM | POA: Diagnosis not present

## 2022-12-19 DIAGNOSIS — Z130 Encounter for screening for diseases of the blood and blood-forming organs and certain disorders involving the immune mechanism: Secondary | ICD-10-CM | POA: Diagnosis not present

## 2022-12-19 DIAGNOSIS — Z1329 Encounter for screening for other suspected endocrine disorder: Secondary | ICD-10-CM | POA: Diagnosis not present

## 2022-12-19 DIAGNOSIS — Z13220 Encounter for screening for lipoid disorders: Secondary | ICD-10-CM | POA: Diagnosis not present

## 2022-12-19 DIAGNOSIS — Z131 Encounter for screening for diabetes mellitus: Secondary | ICD-10-CM | POA: Diagnosis not present

## 2022-12-19 DIAGNOSIS — Z13228 Encounter for screening for other metabolic disorders: Secondary | ICD-10-CM | POA: Diagnosis not present

## 2023-02-11 DIAGNOSIS — D225 Melanocytic nevi of trunk: Secondary | ICD-10-CM | POA: Diagnosis not present

## 2023-02-11 DIAGNOSIS — D2272 Melanocytic nevi of left lower limb, including hip: Secondary | ICD-10-CM | POA: Diagnosis not present

## 2023-02-11 DIAGNOSIS — L814 Other melanin hyperpigmentation: Secondary | ICD-10-CM | POA: Diagnosis not present

## 2023-02-11 DIAGNOSIS — D2262 Melanocytic nevi of left upper limb, including shoulder: Secondary | ICD-10-CM | POA: Diagnosis not present

## 2023-03-25 DIAGNOSIS — R7989 Other specified abnormal findings of blood chemistry: Secondary | ICD-10-CM | POA: Diagnosis not present

## 2023-03-25 DIAGNOSIS — G43109 Migraine with aura, not intractable, without status migrainosus: Secondary | ICD-10-CM | POA: Diagnosis not present

## 2023-04-01 DIAGNOSIS — Z0000 Encounter for general adult medical examination without abnormal findings: Secondary | ICD-10-CM | POA: Diagnosis not present

## 2023-04-01 DIAGNOSIS — J383 Other diseases of vocal cords: Secondary | ICD-10-CM | POA: Diagnosis not present

## 2023-04-01 DIAGNOSIS — R82998 Other abnormal findings in urine: Secondary | ICD-10-CM | POA: Diagnosis not present

## 2023-04-01 DIAGNOSIS — Z23 Encounter for immunization: Secondary | ICD-10-CM | POA: Diagnosis not present

## 2023-04-01 DIAGNOSIS — Z1331 Encounter for screening for depression: Secondary | ICD-10-CM | POA: Diagnosis not present

## 2023-04-21 ENCOUNTER — Encounter: Payer: Self-pay | Admitting: Internal Medicine

## 2023-04-29 DIAGNOSIS — L720 Epidermal cyst: Secondary | ICD-10-CM | POA: Diagnosis not present

## 2023-05-15 ENCOUNTER — Ambulatory Visit (AMBULATORY_SURGERY_CENTER): Payer: BC Managed Care – PPO

## 2023-05-15 VITALS — Ht 63.5 in | Wt 120.0 lb

## 2023-05-15 DIAGNOSIS — Z1211 Encounter for screening for malignant neoplasm of colon: Secondary | ICD-10-CM

## 2023-05-15 MED ORDER — SUFLAVE 178.7 G PO SOLR: 1.0000 | Freq: Once | ORAL | 0 refills | Status: AC

## 2023-05-15 NOTE — Progress Notes (Signed)

## 2023-06-05 ENCOUNTER — Encounter: Payer: Self-pay | Admitting: Internal Medicine

## 2023-06-05 ENCOUNTER — Other Ambulatory Visit: Payer: Self-pay | Admitting: Obstetrics and Gynecology

## 2023-06-05 DIAGNOSIS — Z1231 Encounter for screening mammogram for malignant neoplasm of breast: Secondary | ICD-10-CM

## 2023-06-12 ENCOUNTER — Encounter: Payer: BC Managed Care – PPO | Admitting: Internal Medicine

## 2023-07-24 ENCOUNTER — Other Ambulatory Visit: Payer: Self-pay | Admitting: Obstetrics and Gynecology

## 2023-07-24 ENCOUNTER — Ambulatory Visit
Admission: RE | Admit: 2023-07-24 | Discharge: 2023-07-24 | Disposition: A | Discharge status: Home / Self Care | Source: Ambulatory Visit | Attending: Obstetrics and Gynecology | Admitting: Obstetrics and Gynecology

## 2023-07-24 DIAGNOSIS — Z1231 Encounter for screening mammogram for malignant neoplasm of breast: Secondary | ICD-10-CM | POA: Diagnosis not present

## 2023-07-30 ENCOUNTER — Encounter: Payer: Self-pay | Admitting: Internal Medicine

## 2023-07-30 ENCOUNTER — Ambulatory Visit: Admitting: Internal Medicine

## 2023-07-30 VITALS — BP 105/75 | HR 67 | Temp 98.0°F | Resp 12 | Ht 63.5 in | Wt 120.0 lb

## 2023-07-30 DIAGNOSIS — D127 Benign neoplasm of rectosigmoid junction: Secondary | ICD-10-CM

## 2023-07-30 DIAGNOSIS — K6389 Other specified diseases of intestine: Secondary | ICD-10-CM | POA: Diagnosis not present

## 2023-07-30 DIAGNOSIS — Z1211 Encounter for screening for malignant neoplasm of colon: Secondary | ICD-10-CM

## 2023-07-30 DIAGNOSIS — K635 Polyp of colon: Secondary | ICD-10-CM | POA: Diagnosis not present

## 2023-07-30 MED ORDER — SODIUM CHLORIDE 0.9 % IV SOLN: 500.0000 mL | Freq: Once | INTRAVENOUS | Status: DC

## 2023-07-30 NOTE — Patient Instructions (Signed)

## 2023-07-30 NOTE — Op Note (Addendum)
 [facility] Patient Name: Cynthia Dodson Procedure Date: 07/30/2023 8:59 AM MRN: [id number] Endoscopist: Nannette Babe , MD, [id number] Age: 48 Referring MD:  Date of Birth: 01-Mar-1976 Gender: Female Account #: [id number] Procedure:                Colonoscopy Indications:              Screening for colorectal malignant neoplasm, This                            is the patient's first colonoscopy Medicines:                Monitored Anesthesia Care Procedure:                Pre-Anesthesia Assessment:                           - Prior to the procedure, a History and Physical                            was performed, and patient medications and                            allergies were reviewed. The patient's tolerance of                            previous anesthesia was also reviewed. The risks                            and benefits of the procedure and the sedation                            options and risks were discussed with the patient.                            All questions were answered, and informed consent                            was obtained. Prior Anticoagulants: The patient has                            taken no anticoagulant or antiplatelet agents. ASA                            Grade Assessment: II - A patient with mild systemic                            disease. After reviewing the risks and benefits,                            the patient was deemed in satisfactory condition to                            undergo the procedure.  After obtaining informed consent, the colonoscope                            was passed under direct vision. Throughout the                            procedure, the patient's blood pressure, pulse, and                            oxygen saturations were monitored continuously. The                            Olympus Scope SN: [id number] was introduced through                            the anus and advanced to  the cecum and terminal                            ileum, identified by appendiceal orifice and                            ileocecal valve. The colonoscopy was performed                            without difficulty. The patient tolerated the                            procedure well. The quality of the bowel                            preparation was good. The terminal ileum, ileocecal                            valve, appendiceal orifice, and rectum were                            photographed. Scope In: 9:14:52 AM Scope Out: 9:34:51 AM Scope Withdrawal Time: 0 hours 14 minutes 30 seconds  Total Procedure Duration: 0 hours 19 minutes 59 seconds  Findings:                 The digital rectal exam was normal.                           The terminal ileum appeared normal.                           A 4 mm polyp was found in the recto-sigmoid colon.                            The polyp was sessile. The polyp was removed with a                            cold snare. Resection and retrieval were complete.  A diffuse area of moderate melanosis was found in                            the entire colon.                           The exam was otherwise without abnormality on                            direct and retroflexion views. Complications:            No immediate complications. Estimated Blood Loss:     Estimated blood loss: none. Impression:               - The examined portion of the ileum was normal.                           - One 4 mm polyp at the recto-sigmoid colon,                            removed with a cold snare. Resected and retrieved.                           - Melanosis in the colon.                           - The examination was otherwise normal on direct                            and retroflexion views. Recommendation:           - Patient has a contact number available for                            emergencies. The signs and symptoms of potential                             delayed complications were discussed with the                            patient. Return to normal activities tomorrow.                            Written discharge instructions were provided to the                            patient.                           - Resume previous diet.                           - Continue present medications.                           - Await pathology results.                           -  Repeat colonoscopy is recommended. The                            colonoscopy date will be determined after pathology                            results from today's exam become available for                            review. Nannette Babe, MD 07/30/2023 9:38:27 AM This report has been signed electronically.

## 2023-07-30 NOTE — Progress Notes (Signed)
 Called to room to assist during endoscopic procedure.  Patient ID and intended procedure confirmed with present staff. Received instructions for my participation in the procedure from the performing physician.

## 2023-07-30 NOTE — Progress Notes (Signed)
 GASTROENTEROLOGY PROCEDURE H&P NOTE   Primary Care Physician: Jeannine Milroy., MD    Reason for Procedure:  Colon cancer screening  Plan:    Colonoscopy  Patient is appropriate for endoscopic procedure(s) in the ambulatory ([facility]) setting.  The nature of the procedure, as well as the risks, benefits, and alternatives were carefully and thoroughly reviewed with the patient. Ample time for discussion and questions allowed. The patient understood, was satisfied, and agreed to proceed.     HPI: Cynthia Dodson is a 48 y.o. female who presents for colonoscopy.  Medical history as below.  Tolerated the prep.  No recent chest pain or shortness of breath.  No abdominal pain today.  Past Medical History:  Diagnosis Date   Cystitis, interstitial    Family history of breast cancer    Family history of cervical cancer    Family history of colon cancer    Family history of ovarian cancer    Family history of uterine cancer    Headache(784.0)    Migraine with aura     Past Surgical History:  Procedure Laterality Date   BREAST SURGERY     POLYPECTOMY     TONSILLECTOMY     TOOTH EXTRACTION     Nov 2024   WISDOM TOOTH EXTRACTION      Prior to Admission medications   Medication Sig Start Date End Date Taking? Authorizing Provider  Multiple Vitamin (MULTIVITAMIN PO) Take by mouth.   Yes [provider]  OVER THE COUNTER MEDICATION Take 2 capsules by mouth in the morning and at bedtime. Corticare   Yes [provider]  progesterone (PROMETRIUM) 100 MG capsule 1 capsule at bedtime Orally Once a day   Yes [provider]  Ascorbic Acid (VITAMIN C PO) Take by mouth.    [provider]  Multiple Vitamins-Minerals (ZINC PO) Take by mouth. Patient not taking: Reported on 05/15/2023    [provider]  OVER THE COUNTER MEDICATION Take 3 capsules by mouth daily. [place] Patient not taking: Reported on 05/15/2023    [provider]  OVER THE COUNTER MEDICATION Take 1 tablet by mouth daily. Patient not taking: Reported on 07/30/2023    [provider]  SUMAtriptan (IMITREX) 100 MG tablet Take 100 mg by mouth once. 04/01/23   [provider]  VITAMIN D PO Take by mouth.    [provider]    Current Outpatient Medications  Medication Sig Dispense Refill   Multiple Vitamin (MULTIVITAMIN PO) Take by mouth.     OVER THE COUNTER MEDICATION Take 2 capsules by mouth in the morning and at bedtime. Corticare     progesterone (PROMETRIUM) 100 MG capsule 1 capsule at bedtime Orally Once a day     Ascorbic Acid (VITAMIN C PO) Take by mouth.     Multiple Vitamins-Minerals (ZINC PO) Take by mouth. (Patient not taking: Reported on 05/15/2023)     OVER THE COUNTER MEDICATION Take 3 capsules by mouth daily. [place] (Patient not taking: Reported on 05/15/2023)     OVER THE COUNTER MEDICATION Take 1 tablet by mouth daily. (Patient not taking: Reported on 07/30/2023)     SUMAtriptan (IMITREX) 100 MG tablet Take 100 mg by mouth once.     VITAMIN D PO Take by mouth.     Current Facility-Administered Medications  Medication Dose Route Frequency Provider Last Rate Last Admin   0.9 %  sodium chloride  infusion  500 mL Intravenous Once Donny Heffern,  Amber Bail, MD        Allergies as of 07/30/2023   (No Known Allergies)    Family History  Problem Relation Age of Onset   Depression Mother    Fibromyalgia Mother    Thyroid  disease Mother    Hypertension Mother    Ovarian cancer Mother 59       OVARIAN, negative genetic testing 2010 and 2019   Thyroid  disease Maternal Grandmother    Uterine cancer Maternal Grandmother 36       UTERINE   Hypertension Paternal Grandmother    Cervical cancer Other        MGM's sister   Colon cancer Other        dx unknown age; MGM's brother   Breast cancer Other        dx 70s/80s; PGF's sister   Rectal cancer Neg Hx    Stomach cancer Neg Hx     Social History   Socioeconomic  History   Marital status: Married    Spouse name: Not on file   Number of children: Not on file   Years of education: Not on file   Highest education level: Not on file  Occupational History   Not on file  Tobacco Use   Smoking status: Never   Smokeless tobacco: Never  Vaping Use   Vaping status: Never Used  Substance and Sexual Activity   Alcohol  use: Yes    Alcohol /week: 1.0 - 2.0 standard drink of alcohol     Types: 1 - 2 Standard drinks or equivalent per week    Comment: OCCASIONAL   Drug use: No   Sexual activity: Yes    Birth control/protection: Other-see comments    Comment: Vasectomy  Other Topics Concern   Not on file  Social History Narrative   Not on file   Social Drivers of Health   Financial Resource Strain: Not on file  Food Insecurity: Not on file  Transportation Needs: Not on file  Physical Activity: Not on file  Stress: Not on file  Social Connections: Not on file  Intimate Partner Violence: Not on file    Physical Exam: Vital signs in last 24 hours: @BP  107/76   Pulse 66   Temp 98 F (36.7 C) (Temporal)   Ht 5' 3.5" (1.613 m)   Wt 120 lb (54.4 kg)   SpO2 100%   BMI 20.92 kg/m  GEN: NAD EYE: Sclerae anicteric ENT: MMM CV: Non-tachycardic Pulm: CTA b/l GI: Soft, NT/ND NEURO:  Alert & Oriented x 3   Laurell Pond, MD [facility]  07/30/2023 8:51 AM

## 2023-07-30 NOTE — Progress Notes (Signed)
 A/o x 3, VSS, gd SR's, pleased with anesthesia, report to RN

## 2023-07-31 ENCOUNTER — Telehealth: Payer: Self-pay

## 2023-07-31 NOTE — Telephone Encounter (Signed)
  Follow up Call-     07/30/2023    8:02 AM  Call back number  Post procedure Call Back phone  # [phone]  Permission to leave phone message Yes     Patient questions:  Do you have a fever, pain , or abdominal swelling? No. Pain Score  0 *  Have you tolerated food without any problems? Yes.    Have you been able to return to your normal activities? Yes.    Do you have any questions about your discharge instructions: Diet   No. Medications  No. Follow up visit  No.  Do you have questions or concerns about your Care? No.  Actions: * If pain score is 4 or above: No action needed, pain <4.

## 2023-08-01 LAB — SURGICAL PATHOLOGY

## 2023-08-04 ENCOUNTER — Encounter: Payer: Self-pay | Admitting: Internal Medicine

## 2023-12-25 DIAGNOSIS — Z6821 Body mass index (BMI) 21.0-21.9, adult: Secondary | ICD-10-CM | POA: Diagnosis not present

## 2023-12-25 DIAGNOSIS — Z01419 Encounter for gynecological examination (general) (routine) without abnormal findings: Secondary | ICD-10-CM | POA: Diagnosis not present
